# Patient Record
Sex: Female | Born: 1980 | Hispanic: No | Marital: Married | Smoking: Never smoker
Health system: Southern US, Community
[De-identification: ages and names within clinical notes are randomized; demographics above are authoritative.]

## PROBLEM LIST (undated history)

## (undated) ENCOUNTER — Inpatient Hospital Stay (HOSPITAL_COMMUNITY): Payer: Self-pay

## (undated) DIAGNOSIS — Z789 Other specified health status: Secondary | ICD-10-CM

## (undated) HISTORY — PX: TOOTH EXTRACTION: SUR596

---

## 2006-01-14 ENCOUNTER — Inpatient Hospital Stay (HOSPITAL_COMMUNITY): Admission: AD | Admit: 2006-01-14 | Discharge: 2006-01-16 | Payer: Self-pay | Admitting: Obstetrics

## 2006-06-07 ENCOUNTER — Ambulatory Visit: Payer: Self-pay | Admitting: Nurse Practitioner

## 2006-06-27 ENCOUNTER — Emergency Department (HOSPITAL_COMMUNITY): Admission: EM | Admit: 2006-06-27 | Discharge: 2006-06-27 | Payer: Self-pay | Admitting: Emergency Medicine

## 2006-10-10 ENCOUNTER — Ambulatory Visit: Payer: Self-pay | Admitting: Nurse Practitioner

## 2006-10-14 ENCOUNTER — Ambulatory Visit (HOSPITAL_COMMUNITY): Admission: RE | Admit: 2006-10-14 | Discharge: 2006-10-14 | Payer: Self-pay | Admitting: Family Medicine

## 2006-10-15 ENCOUNTER — Ambulatory Visit (HOSPITAL_COMMUNITY): Admission: RE | Admit: 2006-10-15 | Discharge: 2006-10-15 | Payer: Self-pay | Admitting: Family Medicine

## 2007-01-21 ENCOUNTER — Ambulatory Visit: Payer: Self-pay | Admitting: Nurse Practitioner

## 2007-01-22 ENCOUNTER — Ambulatory Visit: Payer: Self-pay | Admitting: *Deleted

## 2007-02-22 ENCOUNTER — Emergency Department (HOSPITAL_COMMUNITY): Admission: EM | Admit: 2007-02-22 | Discharge: 2007-02-23 | Payer: Self-pay | Admitting: Emergency Medicine

## 2007-02-26 ENCOUNTER — Ambulatory Visit: Payer: Self-pay | Admitting: Nurse Practitioner

## 2007-09-10 ENCOUNTER — Encounter (INDEPENDENT_AMBULATORY_CARE_PROVIDER_SITE_OTHER): Payer: Self-pay | Admitting: *Deleted

## 2008-05-27 ENCOUNTER — Ambulatory Visit: Payer: Self-pay | Admitting: Internal Medicine

## 2008-11-10 ENCOUNTER — Encounter (INDEPENDENT_AMBULATORY_CARE_PROVIDER_SITE_OTHER): Payer: Self-pay | Admitting: Adult Health

## 2008-11-10 ENCOUNTER — Ambulatory Visit: Payer: Self-pay | Admitting: Internal Medicine

## 2008-11-10 LAB — CONVERTED CEMR LAB
Albumin: 4.8 g/dL (ref 3.5–5.2)
Alkaline Phosphatase: 40 units/L (ref 39–117)
BUN: 10 mg/dL (ref 6–23)
Eosinophils Absolute: 0 10*3/uL (ref 0.0–0.7)
Eosinophils Relative: 1 % (ref 0–5)
Glucose, Bld: 86 mg/dL (ref 70–99)
HCT: 41.5 % (ref 36.0–46.0)
Helicobacter Pylori Antibody-IgG: 2 — ABNORMAL HIGH
Lipase: 28 units/L (ref 0–75)
Lymphs Abs: 2.3 10*3/uL (ref 0.7–4.0)
MCV: 88.3 fL (ref 78.0–100.0)
Monocytes Relative: 5 % (ref 3–12)
RBC: 4.7 M/uL (ref 3.87–5.11)
Total Bilirubin: 0.5 mg/dL (ref 0.3–1.2)
WBC: 8.1 10*3/uL (ref 4.0–10.5)

## 2008-12-08 ENCOUNTER — Encounter: Payer: Self-pay | Admitting: Family Medicine

## 2008-12-08 ENCOUNTER — Ambulatory Visit: Payer: Self-pay | Admitting: Family Medicine

## 2008-12-08 LAB — CONVERTED CEMR LAB
Basophils Absolute: 0 10*3/uL (ref 0.0–0.1)
Hemoglobin: 13.4 g/dL (ref 12.0–15.0)
Lymphocytes Relative: 24 % (ref 12–46)
Monocytes Absolute: 0.5 10*3/uL (ref 0.1–1.0)
Neutro Abs: 6.9 10*3/uL (ref 1.7–7.7)
RBC: 4.72 M/uL (ref 3.87–5.11)
RDW: 13.1 % (ref 11.5–15.5)
Rubella: 184.6 intl units/mL — ABNORMAL HIGH
Sickle Cell Screen: NEGATIVE

## 2008-12-09 ENCOUNTER — Encounter: Payer: Self-pay | Admitting: Family Medicine

## 2008-12-15 ENCOUNTER — Ambulatory Visit: Payer: Self-pay | Admitting: Family Medicine

## 2008-12-15 ENCOUNTER — Encounter: Payer: Self-pay | Admitting: Family Medicine

## 2008-12-15 LAB — CONVERTED CEMR LAB
Blood in Urine, dipstick: NEGATIVE
Ketones, urine, test strip: NEGATIVE
Nitrite: NEGATIVE
Specific Gravity, Urine: 1.02

## 2008-12-20 ENCOUNTER — Ambulatory Visit: Payer: Self-pay | Admitting: Family Medicine

## 2008-12-23 ENCOUNTER — Ambulatory Visit: Payer: Self-pay | Admitting: Family Medicine

## 2008-12-23 ENCOUNTER — Encounter: Payer: Self-pay | Admitting: Family Medicine

## 2009-01-13 ENCOUNTER — Ambulatory Visit: Payer: Self-pay | Admitting: Family Medicine

## 2009-01-13 LAB — CONVERTED CEMR LAB

## 2009-02-16 ENCOUNTER — Ambulatory Visit: Payer: Self-pay | Admitting: Family Medicine

## 2009-02-16 ENCOUNTER — Encounter: Payer: Self-pay | Admitting: Family Medicine

## 2009-02-18 ENCOUNTER — Encounter: Payer: Self-pay | Admitting: Family Medicine

## 2009-03-16 ENCOUNTER — Encounter: Payer: Self-pay | Admitting: Family Medicine

## 2009-03-17 ENCOUNTER — Ambulatory Visit: Payer: Self-pay | Admitting: Family Medicine

## 2009-03-24 ENCOUNTER — Encounter: Payer: Self-pay | Admitting: Family Medicine

## 2009-03-24 DIAGNOSIS — B019 Varicella without complication: Secondary | ICD-10-CM | POA: Insufficient documentation

## 2009-04-14 ENCOUNTER — Ambulatory Visit: Payer: Self-pay | Admitting: Family Medicine

## 2009-05-03 ENCOUNTER — Ambulatory Visit: Payer: Self-pay | Admitting: Family Medicine

## 2009-05-03 ENCOUNTER — Encounter: Payer: Self-pay | Admitting: Family Medicine

## 2009-05-03 LAB — CONVERTED CEMR LAB
HCT: 33.4 % — ABNORMAL LOW (ref 36.0–46.0)
Hemoglobin: 11.2 g/dL — ABNORMAL LOW (ref 12.0–15.0)
MCV: 81.7 fL (ref 78.0–100.0)
RBC: 4.09 M/uL (ref 3.87–5.11)
WBC: 10.5 10*3/uL (ref 4.0–10.5)

## 2009-05-19 ENCOUNTER — Ambulatory Visit: Payer: Self-pay | Admitting: Family Medicine

## 2009-05-19 LAB — CONVERTED CEMR LAB
Glucose, Urine, Semiquant: NEGATIVE
Protein, U semiquant: NEGATIVE

## 2009-06-02 ENCOUNTER — Ambulatory Visit: Payer: Self-pay | Admitting: Family Medicine

## 2009-06-02 ENCOUNTER — Encounter (INDEPENDENT_AMBULATORY_CARE_PROVIDER_SITE_OTHER): Payer: Self-pay | Admitting: *Deleted

## 2009-06-14 ENCOUNTER — Encounter: Payer: Self-pay | Admitting: Family Medicine

## 2009-06-14 ENCOUNTER — Ambulatory Visit: Payer: Self-pay | Admitting: Family Medicine

## 2009-06-15 ENCOUNTER — Encounter: Payer: Self-pay | Admitting: Family Medicine

## 2009-06-23 ENCOUNTER — Ambulatory Visit: Payer: Self-pay | Admitting: Family Medicine

## 2009-06-24 ENCOUNTER — Ambulatory Visit: Payer: Self-pay | Admitting: Family Medicine

## 2009-06-24 ENCOUNTER — Encounter: Payer: Self-pay | Admitting: Family Medicine

## 2009-06-29 ENCOUNTER — Ambulatory Visit: Payer: Self-pay | Admitting: Family Medicine

## 2009-07-06 ENCOUNTER — Ambulatory Visit: Payer: Self-pay | Admitting: Family Medicine

## 2009-07-12 ENCOUNTER — Ambulatory Visit: Payer: Self-pay | Admitting: Advanced Practice Midwife

## 2009-07-12 ENCOUNTER — Encounter: Payer: Self-pay | Admitting: Family Medicine

## 2009-07-12 ENCOUNTER — Inpatient Hospital Stay (HOSPITAL_COMMUNITY): Admission: AD | Admit: 2009-07-12 | Discharge: 2009-07-12 | Payer: Self-pay | Admitting: Obstetrics & Gynecology

## 2009-07-15 ENCOUNTER — Ambulatory Visit: Payer: Self-pay | Admitting: Family Medicine

## 2009-07-16 ENCOUNTER — Inpatient Hospital Stay (HOSPITAL_COMMUNITY): Admission: AD | Admit: 2009-07-16 | Discharge: 2009-07-16 | Payer: Self-pay | Admitting: *Deleted

## 2009-07-18 ENCOUNTER — Encounter: Payer: Self-pay | Admitting: Family Medicine

## 2009-07-19 ENCOUNTER — Encounter: Payer: Self-pay | Admitting: Family Medicine

## 2009-07-19 ENCOUNTER — Ambulatory Visit: Payer: Self-pay | Admitting: Obstetrics & Gynecology

## 2009-07-19 ENCOUNTER — Ambulatory Visit (HOSPITAL_COMMUNITY): Admission: RE | Admit: 2009-07-19 | Discharge: 2009-07-19 | Payer: Self-pay | Admitting: Family Medicine

## 2009-07-22 ENCOUNTER — Inpatient Hospital Stay (HOSPITAL_COMMUNITY): Admission: AD | Admit: 2009-07-22 | Discharge: 2009-07-24 | Payer: Self-pay | Admitting: Obstetrics & Gynecology

## 2009-07-22 ENCOUNTER — Ambulatory Visit: Payer: Self-pay | Admitting: Obstetrics & Gynecology

## 2009-07-22 ENCOUNTER — Ambulatory Visit: Payer: Self-pay | Admitting: Family Medicine

## 2009-07-25 ENCOUNTER — Encounter (INDEPENDENT_AMBULATORY_CARE_PROVIDER_SITE_OTHER): Payer: Self-pay | Admitting: *Deleted

## 2009-07-27 ENCOUNTER — Ambulatory Visit: Admission: RE | Admit: 2009-07-27 | Discharge: 2009-07-27 | Payer: Self-pay | Admitting: Family Medicine

## 2009-09-16 ENCOUNTER — Ambulatory Visit: Payer: Self-pay | Admitting: Family Medicine

## 2009-09-16 ENCOUNTER — Encounter: Payer: Self-pay | Admitting: Family Medicine

## 2010-05-04 ENCOUNTER — Ambulatory Visit: Payer: Self-pay | Admitting: Family Medicine

## 2010-05-04 DIAGNOSIS — L293 Anogenital pruritus, unspecified: Secondary | ICD-10-CM | POA: Insufficient documentation

## 2010-05-04 LAB — CONVERTED CEMR LAB: Whiff Test: NEGATIVE

## 2011-01-15 ENCOUNTER — Encounter: Payer: Self-pay | Admitting: Family Medicine

## 2011-01-22 ENCOUNTER — Ambulatory Visit: Admit: 2011-01-22 | Payer: Self-pay

## 2011-01-23 ENCOUNTER — Ambulatory Visit: Admission: RE | Admit: 2011-01-23 | Discharge: 2011-01-23 | Payer: Self-pay | Source: Home / Self Care

## 2011-01-23 DIAGNOSIS — H9209 Otalgia, unspecified ear: Secondary | ICD-10-CM | POA: Insufficient documentation

## 2011-01-23 DIAGNOSIS — J029 Acute pharyngitis, unspecified: Secondary | ICD-10-CM | POA: Insufficient documentation

## 2011-01-23 DIAGNOSIS — H6120 Impacted cerumen, unspecified ear: Secondary | ICD-10-CM | POA: Insufficient documentation

## 2011-01-23 LAB — CONVERTED CEMR LAB: Rapid Strep: NEGATIVE

## 2011-01-23 NOTE — Assessment & Plan Note (Signed)
Summary: female problem,tcb   Vital Signs:  Patient profile:   29 year old female Weight:      154 pounds Pulse rate:   80 / minute BP sitting:   114 / 79  (left arm) Cuff size:   regular  Vitals Entered By: Arlyss Repress CMA, (May 04, 2010 3:39 PM) CC: vaginal itching x 1 week. used vagisil OTC, not helping. denies vag d/c Is Patient Diabetic? No Pain Assessment Patient in pain? no        Primary Care Provider:  CAT TA MD  CC:  vaginal itching x 1 week. used vagisil OTC and not helping. denies vag d/c.  History of Present Illness:  Vaginal itching x 1 week, noticed redness on outside of vagina, no vaginal discharge, no abd pain, no fever. Denies change in soap, toliet paper, lotions. No pain with sex. LMP approx 8 months ago, still breastfeeding  Habits & Providers  Alcohol-Tobacco-Diet     Tobacco Status: never  Current Medications (verified): 1)  Gnp Prenatal Vitamins  Tabs (Prenatal Vit-Fe Fumarate-Fa) .... Take One Tablet By Mouth Once Daily 2)  Triamcinolone Acetonide 0.1 % Oint (Triamcinolone Acetonide) .... Apply To Affected Areas Two Times A Day X 10 Days As Needed Itching  Allergies (verified): No Known Drug Allergies  Physical Exam  General:  Well-developed,well-nourished,in no acute distress; alert,appropriate and cooperative throughout examination Vital signs noted  Genitalia:  normal introitus, mucosa pink and moist, no vaginal or cervical lesions, and no friaility or hemorrhage. Clear non odorous discharge visualzied. Erythema and irriation of labia majora noted into inguinal creases. No LAD   Impression & Recommendations:  Problem # 1:  VAGINAL PRURITUS (ICD-698.1) Assessment New  No signs of infection.wet prep normal. Will treat symptoms avoid allergens, discussed lubrication during sex. Unsure of cause of current episode of irritation. Topical Steroid low dose ointment as needed. Pt also asked about a salt water rinse they use in her country,  told her it was okay as long as it was placed inside the vaginal canal.  Orders: [facility]- Est Level  3 (27253)  Complete Medication List: 1)  Gnp Prenatal Vitamins Tabs (Prenatal vit-fe fumarate-fa) .... Take one tablet by mouth once daily 2)  Triamcinolone Acetonide 0.1 % Oint (Triamcinolone acetonide) .... Apply to affected areas two times a day x 10 days as needed itching  [facility]  Patient Instructions: 1)  It is okay to try the salt water, do not place inside  2)  Use the steroid on skin only Prescriptions: TRIAMCINOLONE ACETONIDE 0.1 % OINT (TRIAMCINOLONE ACETONIDE) apply to affected areas two times a day x 10 days as needed itching  #1 x 0   Entered and Authorized by:   Milinda Antis MD   Signed by:   Milinda Antis MD on 05/04/2010   Method used:   Electronically to        [facility] [address] (retail)       [phone]       [place], [place]  [address]       Ph: [phone]       Fax: [phone]   RxID:   [id number]   Laboratory Results  Date/Time Received: May 04, 2010 4:09 PM  Date/Time Reported: May 04, 2010 4:15 PM   [employer] Source: vaginal WBC/hpf: 5-10 Bacteria/hpf: 1+  Rods Clue cells/hpf: none  Negative whiff Yeast/hpf: none Trichomonas/hpf: none Comments: ...........test performed  by...........Marland KitchenTerese Door, CMA

## 2011-01-31 NOTE — Assessment & Plan Note (Signed)
Summary: cant hear/ta pt/eo   Vital Signs:  Patient profile:   30 year old female Height:      64.5 inches Weight:      148 pounds BMI:     25.10 Temp:     98.5 degrees F oral Pulse rate:   96 / minute BP sitting:   130 / 84  (left arm) Cuff size:   regular  Vitals Entered By: Tessie Fass CMA (January 23, 2011 1:41 PM) CC: sore throat. pressure in both ears   Primary Care Provider:  CAT TA MD  CC:  sore throat. pressure in both ears.  History of Present Illness: Hearing decreased, ears are painful for over one week.  Has post nasal drainage, no cough.    Current Medications (verified): 1)  Gnp Prenatal Vitamins  Tabs (Prenatal Vit-Fe Fumarate-Fa) .... Take One Tablet By Mouth Once Daily 2)  Triamcinolone Acetonide 0.1 % Oint (Triamcinolone Acetonide) .... Apply To Affected Areas Two Times A Day X 10 Days As Needed Itching 3)  Flonase 50 Mcg/act Susp (Fluticasone Propionate) .... 2 Sprays in Each Nostril Daily  Allergies: No Known Drug Allergies  Review of Systems General:  Denies chills and fever. ENT:  Complains of decreased hearing, earache, nasal congestion, postnasal drainage, and sore throat; denies sinus pressure. Resp:  Denies cough.  Physical Exam  General:  Well-developed,well-nourished,in no acute distress; alert,appropriate and cooperative throughout examination Ears:  Thick cerumen impactions irrigated out, revealing TM that were retracted Nose:  some inflammation Mouth:  post nasal drainage Neck:  No deformities, masses, or tenderness noted. Lungs:  normal respiratory effort and normal breath sounds.   Heart:  normal rate and regular rhythm.     Impression & Recommendations:  Problem # 1:  CERUMEN IMPACTION, BILATERAL (ICD-380.4) could account for decreased hearing. Orders: Cerumen Impaction Removal-[facility] (78469) [facility]- Est Level  3 (62952)  Problem # 2:  EAR PAIN, BILATERAL (ICD-388.70) suspect inflammatory from post viral infection, add nasal  steroid Orders: Cerumen Impaction Removal-[facility] (84132) [facility]- Est Level  3 (44010)  Complete Medication List: 1)  Gnp Prenatal Vitamins Tabs (Prenatal vit-fe fumarate-fa) .... Take one tablet by mouth once daily 2)  Triamcinolone Acetonide 0.1 % Oint (Triamcinolone acetonide) .... Apply to affected areas two times a day x 10 days as needed itching 3)  Flonase 50 Mcg/act Susp (Fluticasone propionate) .... 2 sprays in each nostril daily  Other Orders: Rapid Strep-[facility] (27253)  Patient Instructions: 1)  Contracted a viral illness that inflammed your sinuses and middle ear 2)  Treat the inflammation with nasal cortisone spray, stay on it for 2 week Prescriptions: FLONASE 50 MCG/ACT SUSP (FLUTICASONE PROPIONATE) 2 sprays in each nostril daily  #1 x 0   Entered and Authorized by:   Luretha Murphy NP   Signed by:   Luretha Murphy NP on 01/23/2011   Method used:   Electronically to        [facility] [address] (retail)       [phone]       [place], [place]  [address]       Ph: [phone]       Fax: [phone]   RxID:   [id number]    Orders Added: 1)  Rapid Strep-[facility] [87430] 2)  Cerumen Impaction Removal-[facility] [55732] 3)  [facility]- Est Level  3 [20254]    Laboratory Results  Date/Time Received: January 23, 2011 1:48 PM  Date/Time Reported: January 23, 2011 2:18 PM   Other Tests  Rapid Strep: negative Comments: ...............test performed by......Marland KitchenBonnie A. [place], MLS (ASCP)cm

## 2011-04-01 LAB — GLUCOSE, CAPILLARY: Glucose-Capillary: 172 mg/dL — ABNORMAL HIGH (ref 70–99)

## 2011-04-01 LAB — CBC
Hemoglobin: 10.9 g/dL — ABNORMAL LOW (ref 12.0–15.0)
MCHC: 32.3 g/dL (ref 30.0–36.0)
MCV: 76.7 fL — ABNORMAL LOW (ref 78.0–100.0)
RBC: 4.17 MIL/uL (ref 3.87–5.11)
RDW: 16.2 % — ABNORMAL HIGH (ref 11.5–15.5)
WBC: 12.5 10*3/uL — ABNORMAL HIGH (ref 4.0–10.5)

## 2011-04-01 LAB — URINE CULTURE: Colony Count: 6000

## 2011-04-01 LAB — URINALYSIS, ROUTINE W REFLEX MICROSCOPIC
Bilirubin Urine: NEGATIVE
Ketones, ur: 15 mg/dL — AB
Nitrite: NEGATIVE
Protein, ur: NEGATIVE mg/dL

## 2011-04-01 LAB — RPR: RPR Ser Ql: NONREACTIVE

## 2011-04-01 LAB — URINE MICROSCOPIC-ADD ON

## 2011-09-10 ENCOUNTER — Other Ambulatory Visit (HOSPITAL_COMMUNITY)
Admission: RE | Admit: 2011-09-10 | Discharge: 2011-09-10 | Disposition: A | Discharge status: Home / Self Care | Payer: Self-pay | Source: Ambulatory Visit | Attending: Family Medicine | Admitting: Family Medicine

## 2011-09-10 ENCOUNTER — Ambulatory Visit (INDEPENDENT_AMBULATORY_CARE_PROVIDER_SITE_OTHER): Payer: Self-pay | Admitting: Family Medicine

## 2011-09-10 ENCOUNTER — Encounter: Payer: Self-pay | Admitting: Family Medicine

## 2011-09-10 VITALS — BP 121/79 | HR 64 | Wt 154.5 lb

## 2011-09-10 DIAGNOSIS — Z01419 Encounter for gynecological examination (general) (routine) without abnormal findings: Secondary | ICD-10-CM | POA: Insufficient documentation

## 2011-09-10 DIAGNOSIS — Z124 Encounter for screening for malignant neoplasm of cervix: Secondary | ICD-10-CM

## 2011-09-10 DIAGNOSIS — N926 Irregular menstruation, unspecified: Secondary | ICD-10-CM

## 2011-09-10 DIAGNOSIS — N912 Amenorrhea, unspecified: Secondary | ICD-10-CM | POA: Insufficient documentation

## 2011-09-10 LAB — COMPREHENSIVE METABOLIC PANEL
AST: 22 U/L (ref 0–37)
Albumin: 4.7 g/dL (ref 3.5–5.2)
Alkaline Phosphatase: 50 U/L (ref 39–117)
Glucose, Bld: 87 mg/dL (ref 70–99)
Potassium: 3.9 mEq/L (ref 3.5–5.3)
Sodium: 138 mEq/L (ref 135–145)
Total Protein: 7.8 g/dL (ref 6.0–8.3)

## 2011-09-10 LAB — CBC
MCH: 27.7 pg (ref 26.0–34.0)
MCHC: 32.3 g/dL (ref 30.0–36.0)
Platelets: 246 10*3/uL (ref 150–400)
RBC: 4.91 MIL/uL (ref 3.87–5.11)
RDW: 13.1 % (ref 11.5–15.5)

## 2011-09-10 LAB — POCT WET PREP (WET MOUNT)
Clue Cells Wet Prep HPF POC: NEGATIVE
Trichomonas Wet Prep HPF POC: NEGATIVE
Yeast Wet Prep HPF POC: NEGATIVE

## 2011-09-10 LAB — TSH: TSH: 1.353 u[IU]/mL (ref 0.350–4.500)

## 2011-09-10 NOTE — Patient Instructions (Signed)
Sometimes stress on your body can make you miss a period Will check labwork today Follow-up with your PCP Dr. Fara Boros in 1-2 months. Use a supplement with 400-800 mg of folic acid

## 2011-09-10 NOTE — Progress Notes (Signed)
  Subjective:    Patient ID: Sheila Gonzales, female    DOB: [birth year], 30 y.o.   MRN: [id number]  HPI  Here for amenorrhea for 2 months.  Has some light spotting 2 weeks ago.  Negative home pregnancy tests.  Had normal periods for past 9 years.  No contraception, some condom use.  + constipation, losing hair.  Notes increasingly stressed- worried about kids.  Also notes worsening stress since moving to [place] in 2006.  No recent change sin health or medications. Review of Systems See hpi     Objective:   Physical Exam GEN: Alert & Oriented, No acute distress CV:  Regular Rate & Rhythm, no murmur Respiratory:  Normal work of breathing, CTAB Abd:  + BS, soft, no tenderness to palpation Ext: no pre-tibial edema Pelvic Exam:        External: normal female genitalia without lesions or masses        Vagina: normal without lesions or masses        Cervix: normal without lesions or masses        Adnexa: normal bimanual exam without masses or fullness        Uterus: normal by palpation        Pap smear: performed        Samples for Wet prep, GC/Chlamydia obtained         Assessment & Plan:

## 2011-09-11 ENCOUNTER — Encounter: Payer: Self-pay | Admitting: Family Medicine

## 2011-09-11 NOTE — Assessment & Plan Note (Signed)
Within range of normal to miss period for one month occasionally- discussed this with patient.  Given other symptoms and patient concern, will check TSH, CBC, CMET, TSH, FSH.  upreg neg today  Advised to continue to watch.  Return if no further period in 1-2 months.  Advised folic acid supplementation as she does not use contraception.

## 2011-09-26 ENCOUNTER — Ambulatory Visit: Payer: Self-pay | Admitting: Family Medicine

## 2011-09-28 LAB — GLUCOSE, CAPILLARY: Glucose-Capillary: 189 mg/dL — ABNORMAL HIGH (ref 70–99)

## 2013-08-17 ENCOUNTER — Ambulatory Visit (INDEPENDENT_AMBULATORY_CARE_PROVIDER_SITE_OTHER): Payer: Self-pay | Admitting: Family Medicine

## 2013-08-17 ENCOUNTER — Encounter: Payer: Self-pay | Admitting: Family Medicine

## 2013-08-17 VITALS — BP 109/73 | HR 92 | Ht 64.5 in | Wt 150.0 lb

## 2013-08-17 DIAGNOSIS — R519 Headache, unspecified: Secondary | ICD-10-CM | POA: Insufficient documentation

## 2013-08-17 DIAGNOSIS — R51 Headache: Secondary | ICD-10-CM | POA: Insufficient documentation

## 2013-08-17 DIAGNOSIS — Z3201 Encounter for pregnancy test, result positive: Secondary | ICD-10-CM

## 2013-08-17 DIAGNOSIS — Z3481 Encounter for supervision of other normal pregnancy, first trimester: Secondary | ICD-10-CM

## 2013-08-17 DIAGNOSIS — N912 Amenorrhea, unspecified: Secondary | ICD-10-CM

## 2013-08-17 DIAGNOSIS — Z3480 Encounter for supervision of other normal pregnancy, unspecified trimester: Secondary | ICD-10-CM | POA: Insufficient documentation

## 2013-08-17 NOTE — Patient Instructions (Addendum)
Thank you for coming in today. Our test confirmed that you are pregnant, congratulations! From my calculations you are 14 weeks and 4 days pregnant. We gave you a letter that you will need to apply for medicaid. When you have done that and it is pending you can call us to schedule your first ob appointment with all the necessary labs.  For your headaches I recommend drinking more water. If that is not enough you can take tylenol as needed for pain.  I look forward to seeing you throughout your pregnancy. Please don't hesitate to let me know if there is anything I can do to help.  Dr. Richarda Blade  Pregnancy, First Trimester The first trimester is the first 3 months your baby is growing inside you. It is important to follow your doctor's instructions. HOME CARE   Do not smoke.  Do not drink alcohol.  Only take medicine as told by your doctor.  Exercise.  Eat healthy foods. Eat regular, well-balanced meals.  You can have sex (intercourse) if there are no other problems with the pregnancy.  Things that help with morning sickness:  Eat soda crackers before getting up in the morning.  Eat 4 to 5 small meals rather than 3 large meals.  Drink liquids between meals, not during meals.  Go to all appointments as told.  Take all vitamins or supplements as told by your doctor. GET HELP RIGHT AWAY IF:   You develop a fever.  You have a bad smelling fluid that is leaking from your vagina.  There is bleeding from the vagina.  You develop severe belly (abdominal) or back pain.  You throw up (vomit) blood. It may look like coffee grounds.  You lose more than 2 pounds in a week.  You gain 5 pounds or more in a week.  You gain more than 2 pounds in a week and you see puffiness (swelling) in your feet, ankles, or legs.  You have severe dizziness or pass out (faint).  You are around people who have [place] measles, chickenpox, or fifth disease.  You have a headache, watery poop  (diarrhea), pain with peeing (urinating), or cannot breath right. Document Released: 05/28/2008 Document Revised: 03/03/2012 Document Reviewed: 05/28/2008 [facility] Patient Information 2014 [place], [place].

## 2013-08-17 NOTE — Assessment & Plan Note (Signed)
Occur after being outdoors, more since pregnancy - rec increased water intake and tylenol prn

## 2013-08-17 NOTE — Assessment & Plan Note (Addendum)
[id number] - Uncomplicated. Children healthy ages 31, 25 and 4 Patient has positive pregnancy test in office today. Needs letter from [place] for medicaid. Will schedule first OB visit when this is pending. - advised not to smoke or drink alcohol, drink lots of water, eat healthy foods in small frequent meals.

## 2013-08-17 NOTE — Progress Notes (Signed)
  Subjective:    Patient ID: Sheila Gonzales, female    DOB: 12-02-1981, 32 y.o.   MRN: [id number]  Possible Pregnancy   Patient presents with 3 months of amenorrhea for confirmatory pregnancy test.  LMP 05/07/13 [place] [redacted] weeks 4 days [facility] 02/11/14  Patient reports occasional headaches, usually after being outside. Also endorses feeling tired since becoming pregnant but says this is getting better lately. She denies nausea, vomitting but endorses some increased gas and bloating. No constipation.   Review of Systems See HPI    Objective:   Physical Exam  Constitutional: She is oriented to person, place, and time. She appears well-developed and well-nourished. No distress.  HENT:  Head: Normocephalic and atraumatic.  Right Ear: External ear normal.  Left Ear: External ear normal.  Nose: Nose normal.  Mouth/Throat: No oropharyngeal exudate.  Eyes: Conjunctivae are normal. Right eye exhibits no discharge. Left eye exhibits no discharge. No scleral icterus.  Cardiovascular: Normal rate, regular rhythm and normal heart sounds.   No murmur heard. Pulmonary/Chest: Effort normal and breath sounds normal. No respiratory distress. She has no wheezes. She has no rales. She exhibits no tenderness.  Abdominal: Soft. She exhibits no distension and no mass. There is no tenderness.  Fundus palpable ~3cm below umbilicus  Musculoskeletal: Normal range of motion. She exhibits no edema and no tenderness.  Neurological: She is alert and oriented to person, place, and time.  Skin: Skin is warm and dry. She is not diaphoretic. No erythema.  Psychiatric: She has a normal mood and affect.          Assessment & Plan:

## 2013-09-04 ENCOUNTER — Encounter (HOSPITAL_COMMUNITY): Payer: Self-pay | Admitting: *Deleted

## 2013-09-04 ENCOUNTER — Inpatient Hospital Stay (HOSPITAL_COMMUNITY)
Admission: AD | Admit: 2013-09-04 | Discharge: 2013-09-04 | Disposition: A | Discharge status: Home / Self Care | Payer: Medicaid Other | Source: Ambulatory Visit | Attending: Obstetrics & Gynecology | Admitting: Obstetrics & Gynecology

## 2013-09-04 DIAGNOSIS — O0930 Supervision of pregnancy with insufficient antenatal care, unspecified trimester: Secondary | ICD-10-CM | POA: Insufficient documentation

## 2013-09-04 DIAGNOSIS — M545 Low back pain, unspecified: Secondary | ICD-10-CM | POA: Insufficient documentation

## 2013-09-04 DIAGNOSIS — R1031 Right lower quadrant pain: Secondary | ICD-10-CM | POA: Insufficient documentation

## 2013-09-04 DIAGNOSIS — O99891 Other specified diseases and conditions complicating pregnancy: Secondary | ICD-10-CM | POA: Insufficient documentation

## 2013-09-04 DIAGNOSIS — N949 Unspecified condition associated with female genital organs and menstrual cycle: Secondary | ICD-10-CM | POA: Insufficient documentation

## 2013-09-04 HISTORY — DX: Other specified health status: Z78.9

## 2013-09-04 LAB — WET PREP, GENITAL
Clue Cells Wet Prep HPF POC: NONE SEEN
Trich, Wet Prep: NONE SEEN
Yeast Wet Prep HPF POC: NONE SEEN

## 2013-09-04 LAB — URINALYSIS, ROUTINE W REFLEX MICROSCOPIC
Glucose, UA: NEGATIVE mg/dL
Hgb urine dipstick: NEGATIVE
Ketones, ur: NEGATIVE mg/dL
Protein, ur: NEGATIVE mg/dL

## 2013-09-04 LAB — OB RESULTS CONSOLE GC/CHLAMYDIA
Chlamydia: NEGATIVE
Gonorrhea: NEGATIVE

## 2013-09-04 NOTE — MAU Note (Signed)
RLQ & R lower back pain x 4 days, sharp vaginal pain @ times.  Denies bleeding or LOF.  EDC 2/19.  No prenatal care, waiting for Medicaid.

## 2013-09-04 NOTE — MAU Provider Note (Signed)
  History     CSN: [id number]  Arrival date and time: 09/04/13 1610   First Provider Initiated Contact with Patient 09/04/13 1704      Chief Complaint  Patient presents with  . Abdominal Pain  . Back Pain   HPI This is a 32 y.o. female at [redacted]w[redacted]d who presents with c/o low back pain and some RLQ pain for several days. Has occasional sharp vaginal pains.  No leaking or bleeding. No fever, N/V/D/C.   RN Note: RLQ & R lower back pain x 4 days, sharp vaginal pain @ times. Denies bleeding or LOF. EDC 2/19. No prenatal care, waiting for Medicaid.      OB History   Grav Para Term Preterm Abortions TAB SAB Ect Mult Living   4 3 3       3       Past Medical History  Diagnosis Date  . Medical history non-contributory     Past Surgical History  Procedure Laterality Date  . Tooth extraction      Family History  Problem Relation Age of Onset  . Heart disease Father 61    heart failure  . Cancer Maternal Grandmother     eye cancer  . Diabetes Neg Hx   . Stroke Neg Hx     History  Substance Use Topics  . Smoking status: Never Smoker   . Smokeless tobacco: Never Used  . Alcohol Use: No    Allergies: No Known Allergies  No prescriptions prior to admission    Review of Systems  Constitutional: Negative for fever, chills and malaise/fatigue.  Gastrointestinal: Positive for abdominal pain. Negative for nausea, vomiting, diarrhea and constipation.  Genitourinary: Negative for dysuria.  Musculoskeletal: Positive for back pain.  Neurological: Positive for dizziness. Negative for weakness and headaches.   Physical Exam   Blood pressure 111/71, pulse 79, temperature 98 F (36.7 C), temperature source Oral, resp. rate 18, height 5' 4.5" (1.638 m), weight 69.854 kg (154 lb), last menstrual period 05/07/2013.  Physical Exam  Constitutional: She is oriented to person, place, and time. She appears well-developed and well-nourished. No distress.  Cardiovascular: Normal rate.    Respiratory: Effort normal.  GI: Soft. She exhibits no distension and no mass. There is tenderness (Over right round ligament). There is no rebound and no guarding.  Genitourinary:  Cervix long and closed No cervical motion tenderness  Musculoskeletal: Normal range of motion.  Neurological: She is alert and oriented to person, place, and time.  Skin: Skin is warm and dry.  Psychiatric: She has a normal mood and affect.   UA and wet prep both negative   MAU Course  Procedures  MDM Will schedule [place] for 1-2 weeks  Assessment and Plan  A:  SIUP at [redacted]w[redacted]d       Round ligament pain      No prenatal care  P:  Discharge home       Comfort measures       Will refer to clinic  [facility] 09/04/2013, 5:37 PM

## 2013-09-22 ENCOUNTER — Ambulatory Visit (HOSPITAL_COMMUNITY): Admission: RE | Admit: 2013-09-22 | Payer: Self-pay | Source: Ambulatory Visit

## 2013-10-23 ENCOUNTER — Other Ambulatory Visit: Payer: Medicaid Other

## 2013-10-23 DIAGNOSIS — Z331 Pregnant state, incidental: Secondary | ICD-10-CM

## 2013-10-23 LAB — HIV ANTIBODY (ROUTINE TESTING W REFLEX): HIV: NONREACTIVE

## 2013-10-23 NOTE — Progress Notes (Signed)
PRENATAL LABS DONE TODAY [name] 

## 2013-10-24 LAB — OBSTETRIC PANEL
Basophils Absolute: 0 10*3/uL (ref 0.0–0.1)
Eosinophils Absolute: 0 10*3/uL (ref 0.0–0.7)
Hepatitis B Surface Ag: NEGATIVE
Lymphocytes Relative: 16 % (ref 12–46)
Lymphs Abs: 1.6 10*3/uL (ref 0.7–4.0)
MCH: 30.4 pg (ref 26.0–34.0)
Neutrophils Relative %: 79 % — ABNORMAL HIGH (ref 43–77)
Platelets: 235 10*3/uL (ref 150–400)
RBC: 4.05 MIL/uL (ref 3.87–5.11)
RDW: 12.9 % (ref 11.5–15.5)
WBC: 9.5 10*3/uL (ref 4.0–10.5)

## 2013-10-24 LAB — SICKLE CELL SCREEN: Sickle Cell Screen: NEGATIVE

## 2013-10-24 LAB — CULTURE, OB URINE
Colony Count: NO GROWTH
Organism ID, Bacteria: NO GROWTH

## 2013-10-30 ENCOUNTER — Encounter: Payer: Self-pay | Admitting: Family Medicine

## 2013-10-31 ENCOUNTER — Encounter (HOSPITAL_COMMUNITY): Payer: Self-pay | Admitting: Emergency Medicine

## 2013-10-31 ENCOUNTER — Emergency Department (HOSPITAL_COMMUNITY)
Admission: EM | Admit: 2013-10-31 | Discharge: 2013-10-31 | Disposition: A | Discharge status: Home / Self Care | Payer: Medicaid Other | Attending: Emergency Medicine | Admitting: Emergency Medicine

## 2013-10-31 DIAGNOSIS — R42 Dizziness and giddiness: Secondary | ICD-10-CM | POA: Insufficient documentation

## 2013-10-31 DIAGNOSIS — R000 Tachycardia, unspecified: Secondary | ICD-10-CM | POA: Insufficient documentation

## 2013-10-31 DIAGNOSIS — R51 Headache: Secondary | ICD-10-CM | POA: Insufficient documentation

## 2013-10-31 DIAGNOSIS — R002 Palpitations: Secondary | ICD-10-CM | POA: Insufficient documentation

## 2013-10-31 DIAGNOSIS — N390 Urinary tract infection, site not specified: Secondary | ICD-10-CM | POA: Insufficient documentation

## 2013-10-31 DIAGNOSIS — M542 Cervicalgia: Secondary | ICD-10-CM | POA: Insufficient documentation

## 2013-10-31 LAB — URINALYSIS, ROUTINE W REFLEX MICROSCOPIC
Nitrite: NEGATIVE
Protein, ur: NEGATIVE mg/dL
Specific Gravity, Urine: 1.011 (ref 1.005–1.030)
Urobilinogen, UA: 0.2 mg/dL (ref 0.0–1.0)

## 2013-10-31 LAB — BASIC METABOLIC PANEL
CO2: 24 mEq/L (ref 19–32)
Chloride: 99 mEq/L (ref 96–112)
GFR calc Af Amer: >90 mL/min (ref >90)
Potassium: 3.4 mEq/L — ABNORMAL LOW (ref 3.5–5.1)
Sodium: 134 mEq/L — ABNORMAL LOW (ref 135–145)

## 2013-10-31 LAB — URINE MICROSCOPIC-ADD ON

## 2013-10-31 LAB — CBC
MCV: 85.7 fL (ref 78.0–100.0)
Platelets: 221 10*3/uL (ref 150–400)
RBC: 4.06 MIL/uL (ref 3.87–5.11)
RDW: 12.2 % (ref 11.5–15.5)
WBC: 11.9 10*3/uL — ABNORMAL HIGH (ref 4.0–10.5)

## 2013-10-31 LAB — T4, FREE: Free T4: 0.95 ng/dL (ref 0.80–1.80)

## 2013-10-31 LAB — TSH: TSH: 1.628 u[IU]/mL (ref 0.350–4.500)

## 2013-10-31 MED ORDER — CEPHALEXIN 500 MG PO CAPS: 500.0000 mg | ORAL_CAPSULE | Freq: Two times a day (BID) | ORAL | Status: DC

## 2013-10-31 MED ORDER — CEPHALEXIN 500 MG PO CAPS
500.0000 mg | ORAL_CAPSULE | Freq: Once | ORAL | Status: AC
  Administered 2013-10-31: 500 mg via ORAL
  Filled 2013-10-31: qty 1

## 2013-10-31 MED ORDER — SODIUM CHLORIDE 0.9 % IV BOLUS (SEPSIS)
1000.0000 mL | Freq: Once | INTRAVENOUS | Status: AC
  Administered 2013-10-31: 1000 mL via INTRAVENOUS

## 2013-10-31 NOTE — ED Notes (Signed)
Pt on TOCO, OB rapid repsonse RN at bedside0

## 2013-10-31 NOTE — Progress Notes (Signed)
Pt presents to [facility] with c/o her head shaking. States she is unable to controll it. Also c/o heart palpitations.  Pt has 3 other children, ages 109, 61, and 4. All vaginal deliveries. Denies any vaginal bleeding or leaking of fluid. States baby has been moving as usual. Denies ever having had any seizures, migraine headaches or any significant medical hx. Pt's EKG monitor shows sinus tachycardia with a rate of 117. No ectopy.

## 2013-10-31 NOTE — Progress Notes (Signed)
Dr. Debroah Loop in surgery. Will call him back to sprak with him about this pt.

## 2013-10-31 NOTE — ED Notes (Signed)
Pt ambulated w/o difficulty

## 2013-10-31 NOTE — Progress Notes (Signed)
Talked with Dr. Debroah Loop. I told him that the pt is not contracting,no vaginal bleeding or leaking of fluid. Baseline fhr is 150, min- mod variability with 10x10 accels. Pt is okay to be cleared obstetrically.

## 2013-10-31 NOTE — ED Notes (Signed)
Called OB rapid response to assess pt

## 2013-10-31 NOTE — ED Notes (Addendum)
Pt reports being 6 months pregnant, reports heart palpitations since yesterday, left sided neck pain and head "shaking".

## 2013-10-31 NOTE — ED Provider Notes (Signed)
CSN: [id number]     Arrival date & time 10/31/13  0908 History   First MD Initiated Contact with Patient 10/31/13 [phone]     Chief Complaint  Patient presents with  . Palpitations  . Neck Pain    HPI  Sheila Gonzales is a 32 y.o. female with no PMH who presents to the ED for evaluation of palpitations.  History was provided by the patient.  Patient states that last night she developed heart palpitations. She states that they are constant with intermittent fluctuations in severity.  She states she feels a pounding in her chest, neck and head with each beat.  She also has had left sided neck pain, which was present before the onset of her palpitations.  No trauma/injuries. She denies any chest pain or SOB.  She denies any hx of cardiac disease.  She has a mild headache and lightheadedness.  She states she has been eating and drinking adequately.  No emesis or diarrhea.  Patient is 6 month pregnant on 11/11/13.  G4.  She has been obtaining her OB/GYN care from her family practice clinic with no established OB/GYN.  She denies any PMH or hx of previous OB/GYN problems in the past with her previous deliveries.  She has otherwise been well with no fevers, cough, abdominal pain, vaginal bleeding/discharge/leakage, dysuria, or weakness.      Past Medical History  Diagnosis Date  . Medical history non-contributory    Past Surgical History  Procedure Laterality Date  . Tooth extraction     Family History  Problem Relation Age of Onset  . Heart disease Father 43    heart failure  . Cancer Maternal Grandmother     eye cancer  . Diabetes Neg Hx   . Stroke Neg Hx    History  Substance Use Topics  . Smoking status: Never Smoker   . Smokeless tobacco: Never Used  . Alcohol Use: No   OB History   Grav Para Term Preterm Abortions TAB SAB Ect Mult Living   4 3 3       3      Review of Systems  Constitutional: Negative for fever, chills, activity change, appetite change and fatigue.  HENT:  Negative for congestion and rhinorrhea.   Eyes: Negative for visual disturbance.  Respiratory: Negative for cough and shortness of breath.   Cardiovascular: Positive for palpitations. Negative for chest pain and leg swelling.  Gastrointestinal: Negative for nausea, vomiting, abdominal pain, diarrhea and constipation.  Genitourinary: Negative for dysuria, hematuria, decreased urine volume, vaginal bleeding, vaginal discharge, vaginal pain and pelvic pain.  Musculoskeletal: Positive for neck pain. Negative for back pain and gait problem.  Skin: Negative for wound.  Neurological: Positive for light-headedness and headaches. Negative for dizziness, syncope, weakness and numbness.    Allergies  Review of patient's allergies indicates no known allergies.  Home Medications  No current outpatient prescriptions on file. BP 124/65  Pulse 132  Temp(Src) 98.7 F (37.1 C) (Oral)  Resp 22  SpO2 99%  LMP 05/07/2013  Filed Vitals:   10/31/13 1145 10/31/13 1146 10/31/13 1147 10/31/13 1403  BP: 105/62 105/58 105/57 100/64  Pulse: 109 119 103 100  Temp:      TempSrc:      Resp:    16  SpO2:    99%     Physical Exam  Nursing note and vitals reviewed. Constitutional: She is oriented to person, place, and time. She appears well-developed and well-nourished. No distress.  HENT:  Head: Normocephalic and atraumatic.  Right Ear: External ear normal.  Left Ear: External ear normal.  Nose: Nose normal.  Mouth/Throat: Oropharynx is clear and moist. No oropharyngeal exudate.  Uvula midline.  No trismus.  TM's gray and translucent bilaterally  Eyes: Conjunctivae are normal. Pupils are equal, round, and reactive to light. Right eye exhibits no discharge. Left eye exhibits no discharge.  Neck: Normal range of motion. Neck supple. No tracheal deviation present.  Focal tenderness to palpation to the anterior left superior neck with no underlying masses, erythema, edema, ecchymosis, or wounds.  No  cervical spinal tenderness.  No LAD bilaterally  Cardiovascular: Regular rhythm, normal heart sounds and intact distal pulses.  Exam reveals no gallop and no friction rub.   No murmur heard. Tachycardic.  Dorsalis pedis pulses present bilaterally    Pulmonary/Chest: Effort normal and breath sounds normal. No respiratory distress. She has no wheezes. She has no rales. She exhibits no tenderness.  Abdominal: Soft. She exhibits no distension and no mass. There is no tenderness. There is no rebound and no guarding.  Protuberant abdomen  Musculoskeletal: Normal range of motion. She exhibits no edema and no tenderness.  No calf tenderness/edema.    Lymphadenopathy:    She has no cervical adenopathy.  Neurological: She is alert and oriented to person, place, and time.  Skin: Skin is warm and dry. She is not diaphoretic.    ED Course  Procedures (including critical care time) Labs Review Labs Reviewed  CBC  BASIC METABOLIC PANEL  TSH  T4, FREE   Imaging Review No results found.  EKG Interpretation   None      Results for orders placed during the hospital encounter of 10/31/13  CBC      Result Value Range   WBC 11.9 (*) 4.0 - 10.5 K/uL   RBC 4.06  3.87 - 5.11 MIL/uL   Hemoglobin 11.8 (*) 12.0 - 15.0 g/dL   HCT 16.1 (*) 09.6 - 04.5 %   MCV 85.7  78.0 - 100.0 fL   MCH 29.1  26.0 - 34.0 pg   MCHC 33.9  30.0 - 36.0 g/dL   RDW 40.9  81.1 - 91.4 %   Platelets 221  150 - 400 K/uL  BASIC METABOLIC PANEL      Result Value Range   Sodium 134 (*) 135 - 145 mEq/L   Potassium 3.4 (*) 3.5 - 5.1 mEq/L   Chloride 99  96 - 112 mEq/L   CO2 24  19 - 32 mEq/L   Glucose, Bld 94  70 - 99 mg/dL   BUN 4 (*) 6 - 23 mg/dL   Creatinine, Ser 7.82 (*) 0.50 - 1.10 mg/dL   Calcium 9.0  8.4 - 95.6 mg/dL   GFR calc non Af Amer >90  >90 mL/min   GFR calc Af Amer >90  >90 mL/min  TSH      Result Value Range   TSH 1.628  0.350 - 4.500 uIU/mL  T4, FREE      Result Value Range   Free T4 0.95  0.80 -  1.80 ng/dL  URINALYSIS, ROUTINE W REFLEX MICROSCOPIC      Result Value Range   Color, Urine YELLOW  YELLOW   APPearance CLOUDY (*) CLEAR   Specific Gravity, Urine 1.011  1.005 - 1.030   pH 7.5  5.0 - 8.0   Glucose, UA NEGATIVE  NEGATIVE mg/dL   Hgb urine dipstick NEGATIVE  NEGATIVE   Bilirubin Urine NEGATIVE  NEGATIVE   Ketones, ur NEGATIVE  NEGATIVE mg/dL   Protein, ur NEGATIVE  NEGATIVE mg/dL   Urobilinogen, UA 0.2  0.0 - 1.0 mg/dL   Nitrite NEGATIVE  NEGATIVE   Leukocytes, UA LARGE (*) NEGATIVE  URINE MICROSCOPIC-ADD ON      Result Value Range   Squamous Epithelial / LPF MANY (*) RARE   WBC, UA 11-20  <3 WBC/hpf   Bacteria, UA RARE  RARE     Date: 10/31/2013  Rate: 125  Rhythm: sinus tachycardia  QRS Axis: normal  Intervals: normal  ST/T Wave abnormalities: normal  Conduction Disutrbances:none  Narrative Interpretation:   Old EKG Reviewed: none available   MDM   1. Palpitations   2. UTI (urinary tract infection)   3. Tachycardia     Sheila Gonzales is a 32 y.o. female with no PMH who presents to the ED for evaluation of palpitations.  TSH, T4, BMP, UA, and CBC ordered.  Orthostatic vitals ordered.    Rechecks  10:44 AM = Patient resting comfortably.  OB/GYN RN monitoring patient.  No contractions.  Will call attending to see if she can be cleared.   1:18 PM = Patient states she feels better after IV fluids.  Will ambulate and see how she does. 1:45 PM = Patient able to ambulate without difficulty/ataxia/increase in symptoms.      Etiology of palpitations possibly anxiety vs dehydration vs UTI.  Patient was cleared by OB/GYN who came to the ED to monitor the patient.  Patient's tachycardia waxed and waned in the 100's to 130's throughout her ED visit.  There appears to be some anxiety component to her fluctuant heart rate.  Low suspicion for PE at this time.  Patient has no chest pain, SOB, or leg edema/DVT. Low suspicion for cardiac causes given her low risk TIMI  score.  EKG negative for any acute ischemic changes and she is in sinus rhythm.   TSH and FT4 WNL.  Labs otherwise unremarkable.  Her HR improved with IV fluids.  She was found to have a UTI and will be treated with Keflex.  Urine sent for culture.  No vaginal bleeding/discharge/evidence of fetal demise at this time.  Patient remained stable throughout her ED visit.  She was instructed to drink plenty of fluids and follow-up with her OB/GYN.  Return precautions discussed.  Patient in agreement with discharge and plan.     Final impressions: 1. Palpitations  2. Tachycardia  3. UTI     Luiz Iron PA-C   This patient was discussed with Dr. Robyn Haber, PA-C 10/31/13 2139

## 2013-11-01 LAB — URINE CULTURE

## 2013-11-01 NOTE — ED Provider Notes (Signed)
Medical screening examination/treatment/procedure(s) were conducted as a shared visit with non-physician practitioner(s) and myself.  I personally evaluated the patient during the encounter.  EKG Interpretation     Ventricular Rate:  125 PR Interval:  139 QRS Duration: 63 QT Interval:  284 QTC Calculation: 409 R Axis:   54 Text Interpretation:  Sinus tachycardia Borderline repolarization abnormality No previous ECGs available           Well appearing. Fetus with strong HR. No leakage of fluid or bleeding. Palpitations resolved with fluids. Sinus tachycardia. No arrythmia noted. Dc home in good condition. i walked by her room on many occasions and found her HR in the low 90s.  There is an anxiety component   Lyanne Co, MD 11/01/13 [phone]

## 2013-11-05 ENCOUNTER — Other Ambulatory Visit (HOSPITAL_COMMUNITY): Payer: Self-pay | Admitting: Advanced Practice Midwife

## 2013-11-05 ENCOUNTER — Ambulatory Visit (HOSPITAL_COMMUNITY)
Admission: RE | Admit: 2013-11-05 | Discharge: 2013-11-05 | Disposition: A | Discharge status: Home / Self Care | Payer: Medicaid Other | Source: Ambulatory Visit | Attending: Advanced Practice Midwife | Admitting: Advanced Practice Midwife

## 2013-11-05 DIAGNOSIS — N949 Unspecified condition associated with female genital organs and menstrual cycle: Secondary | ICD-10-CM

## 2013-11-05 DIAGNOSIS — O0930 Supervision of pregnancy with insufficient antenatal care, unspecified trimester: Secondary | ICD-10-CM | POA: Insufficient documentation

## 2013-11-05 DIAGNOSIS — Z363 Encounter for antenatal screening for malformations: Secondary | ICD-10-CM | POA: Insufficient documentation

## 2013-11-05 DIAGNOSIS — O358XX0 Maternal care for other (suspected) fetal abnormality and damage, not applicable or unspecified: Secondary | ICD-10-CM | POA: Insufficient documentation

## 2013-11-05 DIAGNOSIS — Z1389 Encounter for screening for other disorder: Secondary | ICD-10-CM | POA: Insufficient documentation

## 2013-11-10 ENCOUNTER — Encounter: Payer: Self-pay | Admitting: Family Medicine

## 2013-11-10 ENCOUNTER — Ambulatory Visit (INDEPENDENT_AMBULATORY_CARE_PROVIDER_SITE_OTHER): Payer: Medicaid Other | Admitting: Family Medicine

## 2013-11-10 VITALS — BP 114/72 | Temp 98.4°F | Wt 168.0 lb

## 2013-11-10 DIAGNOSIS — Z3480 Encounter for supervision of other normal pregnancy, unspecified trimester: Secondary | ICD-10-CM

## 2013-11-10 DIAGNOSIS — Z3482 Encounter for supervision of other normal pregnancy, second trimester: Secondary | ICD-10-CM

## 2013-11-10 NOTE — Patient Instructions (Signed)
You are doing very well. Please start taking a chewable (non gel) vitamin Please come back to our our OB clinic in 4 weeks to meet with Dr. Lum Babe  Second Trimester of Pregnancy The second trimester is from week 13 through week 28, months 4 through 6. The second trimester is often a time when you feel your best. Your body has also adjusted to being pregnant, and you begin to feel better physically. Usually, morning sickness has lessened or quit completely, you may have more energy, and you may have an increase in appetite. The second trimester is also a time when the fetus is growing rapidly. At the end of the sixth month, the fetus is about 9 inches long and weighs about 1 pounds. You will likely begin to feel the baby move (quickening) between 18 and 20 weeks of the pregnancy. BODY CHANGES Your body goes through many changes during pregnancy. The changes vary from woman to woman.   Your weight will continue to increase. You will notice your lower abdomen bulging out.  You may begin to get stretch marks on your hips, abdomen, and breasts.  You may develop headaches that can be relieved by medicines approved by your caregiver.  You may urinate more often because the fetus is pressing on your bladder.  You may develop or continue to have heartburn as a result of your pregnancy.  You may develop constipation because certain hormones are causing the muscles that push waste through your intestines to slow down.  You may develop hemorrhoids or swollen, bulging veins (varicose veins).  You may have back pain because of the weight gain and pregnancy hormones relaxing your joints between the bones in your pelvis and as a result of a shift in weight and the muscles that support your balance.  Your breasts will continue to grow and be tender.  Your gums may bleed and may be sensitive to brushing and flossing.  Dark spots or blotches (chloasma, mask of pregnancy) may develop on your face. This  will likely fade after the baby is born.  A dark line from your belly button to the pubic area (linea nigra) may appear. This will likely fade after the baby is born. WHAT TO EXPECT AT YOUR PRENATAL VISITS During a routine prenatal visit:  You will be weighed to make sure you and the fetus are growing normally.  Your blood pressure will be taken.  Your abdomen will be measured to track your baby's growth.  The fetal heartbeat will be listened to.  Any test results from the previous visit will be discussed. Your caregiver may ask you:  How you are feeling.  If you are feeling the baby move.  If you have had any abnormal symptoms, such as leaking fluid, bleeding, severe headaches, or abdominal cramping.  If you have any questions. Other tests that may be performed during your second trimester include:  Blood tests that check for:  Low iron levels (anemia).  Gestational diabetes (between 24 and 28 weeks).  Rh antibodies.  Urine tests to check for infections, diabetes, or protein in the urine.  An ultrasound to confirm the proper growth and development of the baby.  An amniocentesis to check for possible genetic problems.  Fetal screens for spina bifida and Down syndrome. HOME CARE INSTRUCTIONS   Avoid all smoking, herbs, alcohol, and unprescribed drugs. These chemicals affect the formation and growth of the baby.  Follow your caregiver's instructions regarding medicine use. There are medicines that are either  safe or unsafe to take during pregnancy.  Exercise only as directed by your caregiver. Experiencing uterine cramps is a good sign to stop exercising.  Continue to eat regular, healthy meals.  Wear a good support bra for breast tenderness.  Do not use hot tubs, steam rooms, or saunas.  Wear your seat belt at all times when driving.  Avoid raw meat, uncooked cheese, cat litter boxes, and soil used by cats. These carry germs that can cause birth defects in the  baby.  Take your prenatal vitamins.  Try taking a stool softener (if your caregiver approves) if you develop constipation. Eat more high-fiber foods, such as fresh vegetables or fruit and whole grains. Drink plenty of fluids to keep your urine clear or pale yellow.  Take warm sitz baths to soothe any pain or discomfort caused by hemorrhoids. Use hemorrhoid cream if your caregiver approves.  If you develop varicose veins, wear support hose. Elevate your feet for 15 minutes, 3 4 times a day. Limit salt in your diet.  Avoid heavy lifting, wear low heel shoes, and practice good posture.  Rest with your legs elevated if you have leg cramps or low back pain.  Visit your dentist if you have not gone yet during your pregnancy. Use a soft toothbrush to brush your teeth and be gentle when you floss.  A sexual relationship may be continued unless your caregiver directs you otherwise.  Continue to go to all your prenatal visits as directed by your caregiver. SEEK MEDICAL CARE IF:   You have dizziness.  You have mild pelvic cramps, pelvic pressure, or nagging pain in the abdominal area.  You have persistent nausea, vomiting, or diarrhea.  You have a bad smelling vaginal discharge.  You have pain with urination. SEEK IMMEDIATE MEDICAL CARE IF:   You have a fever.  You are leaking fluid from your vagina.  You have spotting or bleeding from your vagina.  You have severe abdominal cramping or pain.  You have rapid weight gain or loss.  You have shortness of breath with chest pain.  You notice sudden or extreme swelling of your face, hands, ankles, feet, or legs.  You have not felt your baby move in over an hour.  You have severe headaches that do not go away with medicine.  You have vision changes. Document Released: 12/04/2001 Document Revised: 08/12/2013 Document Reviewed: 02/10/2013 [facility] Patient Information 2014 [place], [place].

## 2013-11-10 NOTE — Progress Notes (Signed)
Pt w/o concerns/complaints today.  Denies vaginal bleeding/discharge, RUQ pain, HA, vision change, contractions.  Regular fetal movement Not taking PNV due to upset stomach and religious concerns for possible animal (pig) contents  PE:  Gen: NAD, WNWD, Gravid HEENT: MMM, EOMI CV: RRR, no m/r/g Res: CTAB, NMl effort  A/P: 32yo F G4P3003 at 26.5 w/ single IUP presenting for initial PNC. Uncomplicated pregnancy up to this point but pt is very late to [facility] and not taking PNV (upset stomach and concern for gel vitamins containing pork product) - Pt to start non-gel chewable vitamin - Pt would prefer female provider due to religious reasons. Will accomodate this request - planning to breast feed - Return in 4 wks to [facility] clinic to meed with Dr. Lum Babe - Pt needs 1hr gtt - labor precautions reviewed

## 2013-11-11 NOTE — Progress Notes (Signed)
Reviewed

## 2013-11-18 ENCOUNTER — Encounter: Payer: Self-pay | Admitting: Family Medicine

## 2013-12-08 ENCOUNTER — Ambulatory Visit (INDEPENDENT_AMBULATORY_CARE_PROVIDER_SITE_OTHER): Payer: Medicaid Other | Admitting: Family Medicine

## 2013-12-08 VITALS — BP 115/73 | Wt 178.8 lb

## 2013-12-08 DIAGNOSIS — Z3480 Encounter for supervision of other normal pregnancy, unspecified trimester: Secondary | ICD-10-CM

## 2013-12-08 LAB — GLUCOSE, CAPILLARY
Comment 1: 1
Glucose-Capillary: 181 mg/dL — ABNORMAL HIGH (ref 70–99)

## 2013-12-08 MED ORDER — SALINE SPRAY 0.65 % NA SOLN: 1.0000 | NASAL | Status: AC | PRN

## 2013-12-08 NOTE — Patient Instructions (Signed)
For the palpitations, if you have worsening symptoms, go back to the ED.   Third Trimester of Pregnancy The third trimester is from week 29 through week 42, months 7 through 9. The third trimester is a time when the fetus is growing rapidly. At the end of the ninth month, the fetus is about 20 inches in length and weighs 6 10 pounds.  BODY CHANGES Your body goes through many changes during pregnancy. The changes vary from woman to woman.   Your weight will continue to increase. You can expect to gain 25 35 pounds (11 16 kg) by the end of the pregnancy.  You may begin to get stretch marks on your hips, abdomen, and breasts.  You may urinate more often because the fetus is moving lower into your pelvis and pressing on your bladder.  You may develop or continue to have heartburn as a result of your pregnancy.  You may develop constipation because certain hormones are causing the muscles that push waste through your intestines to slow down.  You may develop hemorrhoids or swollen, bulging veins (varicose veins).  You may have pelvic pain because of the weight gain and pregnancy hormones relaxing your joints between the bones in your pelvis. Back aches may result from over exertion of the muscles supporting your posture.  Your breasts will continue to grow and be tender. A yellow discharge may leak from your breasts called colostrum.  Your belly button may stick out.  You may feel short of breath because of your expanding uterus.  You may notice the fetus "dropping," or moving lower in your abdomen.  You may have a bloody mucus discharge. This usually occurs a few days to a week before labor begins.  Your cervix becomes thin and soft (effaced) near your due date. WHAT TO EXPECT AT YOUR PRENATAL EXAMS  You will have prenatal exams every 2 weeks until week 36. Then, you will have weekly prenatal exams. During a routine prenatal visit:  You will be weighed to make sure you and the fetus  are growing normally.  Your blood pressure is taken.  Your abdomen will be measured to track your baby's growth.  The fetal heartbeat will be listened to.  Any test results from the previous visit will be discussed.  You may have a cervical check near your due date to see if you have effaced. At around 36 weeks, your caregiver will check your cervix. At the same time, your caregiver will also perform a test on the secretions of the vaginal tissue. This test is to determine if a type of bacteria, Group B streptococcus, is present. Your caregiver will explain this further. Your caregiver may ask you:  What your birth plan is.  How you are feeling.  If you are feeling the baby move.  If you have had any abnormal symptoms, such as leaking fluid, bleeding, severe headaches, or abdominal cramping.  If you have any questions. Other tests or screenings that may be performed during your third trimester include:  Blood tests that check for low iron levels (anemia).  Fetal testing to check the health, activity level, and growth of the fetus. Testing is done if you have certain medical conditions or if there are problems during the pregnancy. FALSE LABOR You may feel small, irregular contractions that eventually go away. These are called Braxton Hicks contractions, or false labor. Contractions may last for hours, days, or even weeks before true labor sets in. If contractions come at regular  intervals, intensify, or become painful, it is best to be seen by your caregiver.  SIGNS OF LABOR   Menstrual-like cramps.  Contractions that are 5 minutes apart or less.  Contractions that start on the top of the uterus and spread down to the lower abdomen and back.  A sense of increased pelvic pressure or back pain.  A watery or bloody mucus discharge that comes from the vagina. If you have any of these signs before the 37th week of pregnancy, call your caregiver right away. You need to go to the  hospital to get checked immediately. HOME CARE INSTRUCTIONS   Avoid all smoking, herbs, alcohol, and unprescribed drugs. These chemicals affect the formation and growth of the baby.  Follow your caregiver's instructions regarding medicine use. There are medicines that are either safe or unsafe to take during pregnancy.  Exercise only as directed by your caregiver. Experiencing uterine cramps is a good sign to stop exercising.  Continue to eat regular, healthy meals.  Wear a good support bra for breast tenderness.  Do not use hot tubs, steam rooms, or saunas.  Wear your seat belt at all times when driving.  Avoid raw meat, uncooked cheese, cat litter boxes, and soil used by cats. These carry germs that can cause birth defects in the baby.  Take your prenatal vitamins.  Try taking a stool softener (if your caregiver approves) if you develop constipation. Eat more high-fiber foods, such as fresh vegetables or fruit and whole grains. Drink plenty of fluids to keep your urine clear or pale yellow.  Take warm sitz baths to soothe any pain or discomfort caused by hemorrhoids. Use hemorrhoid cream if your caregiver approves.  If you develop varicose veins, wear support hose. Elevate your feet for 15 minutes, 3 4 times a day. Limit salt in your diet.  Avoid heavy lifting, wear low heal shoes, and practice good posture.  Rest a lot with your legs elevated if you have leg cramps or low back pain.  Visit your dentist if you have not gone during your pregnancy. Use a soft toothbrush to brush your teeth and be gentle when you floss.  A sexual relationship may be continued unless your caregiver directs you otherwise.  Do not travel far distances unless it is absolutely necessary and only with the approval of your caregiver.  Take prenatal classes to understand, practice, and ask questions about the labor and delivery.  Make a trial run to the hospital.  Pack your hospital bag.  Prepare  the baby's nursery.  Continue to go to all your prenatal visits as directed by your caregiver. SEEK MEDICAL CARE IF:  You are unsure if you are in labor or if your water has broken.  You have dizziness.  You have mild pelvic cramps, pelvic pressure, or nagging pain in your abdominal area.  You have persistent nausea, vomiting, or diarrhea.  You have a bad smelling vaginal discharge.  You have pain with urination. SEEK IMMEDIATE MEDICAL CARE IF:   You have a fever.  You are leaking fluid from your vagina.  You have spotting or bleeding from your vagina.  You have severe abdominal cramping or pain.  You have rapid weight loss or gain.  You have shortness of breath with chest pain.  You notice sudden or extreme swelling of your face, hands, ankles, feet, or legs.  You have not felt your baby move in over an hour.  You have severe headaches that do not go away  with medicine.  You have vision changes. Document Released: 12/04/2001 Document Revised: 08/12/2013 Document Reviewed: 02/10/2013 [facility] Patient Information 2014 [place], [place].

## 2013-12-08 NOTE — Progress Notes (Signed)
S: reports low abdominal discomfort worst with walking. Otherwise, no contractions, vaginal bleeding, vaginal discharge. Feeling good fetal movement.  Regarding her history of tachycardia, she denies any recent episodes of heart flutter or chest discomfort. She was seen in the ED in November for palpitations and treated for a UTI. Since then, she hasn't had any reported issues.  O: see flowsheet Maternal HR: 100 A/P:  32 yo G4P3003 at 30.5wga who presents for prenatal visit - tachycardia: asymptomatic at this time, but will continue to monitor - weight gain: 10lb weight gain in 4 weeks which is more than recommended. She is measuring appropriately which is reassuring. Reviewed decreasing amounts of sweet foods: dates, raisins and reducing starchy foods like potatoes and increasing fresh vegetables.  - glucola 1hr today - follow up in 2 weeks

## 2013-12-09 ENCOUNTER — Other Ambulatory Visit (INDEPENDENT_AMBULATORY_CARE_PROVIDER_SITE_OTHER): Payer: Medicaid Other

## 2013-12-09 DIAGNOSIS — Z3480 Encounter for supervision of other normal pregnancy, unspecified trimester: Secondary | ICD-10-CM

## 2013-12-09 DIAGNOSIS — Z331 Pregnant state, incidental: Secondary | ICD-10-CM

## 2013-12-09 NOTE — Progress Notes (Signed)
3 HR GTT DONE TODAY Sheila Gonzales 

## 2013-12-10 LAB — GLUCOSE TOLERANCE, 3 HOURS
Glucose Tolerance, 1 hour: 166 mg/dL (ref 70–189)
Glucose, GTT - 3 Hour: 138 mg/dL (ref 70–144)

## 2013-12-24 NOTE — L&D Delivery Note (Signed)
Delivery Note At 3:49 PM a viable female was delivered via Vaginal, Spontaneous Delivery (Presentation: Left Occiput Anterior). Loose nuchal x1. No shoulder dystocia.   APGAR: 8, 9; weight .  Placenta status: Intact, Spontaneous.  Cord: 3 vessels with the following complications: None.    Anesthesia: None  Episiotomy: None Lacerations: none Suture Repair: none Est. Blood Loss (mL): 450  Mom to postpartum.  Baby to Couplet care / Skin to Skin.  Marena ChancyLOSQ, STEPHANIE 02/08/2014, 4:18 PM  I was present for this delivery and agree with the above resident's note.    Sharen CounterLisa Leftwich-Kirby Certified Nurse-Midwife

## 2013-12-28 ENCOUNTER — Encounter: Payer: Medicaid Other | Admitting: Family Medicine

## 2013-12-29 IMAGING — US US OB DETAIL+14 WK
2 series · 12 of 28 positions shown · non-contrast
Comparison: none

[Series 1: us ob detail +14 wk · 9 of 57 slices shown (1 of 2)]
[im 3/57]
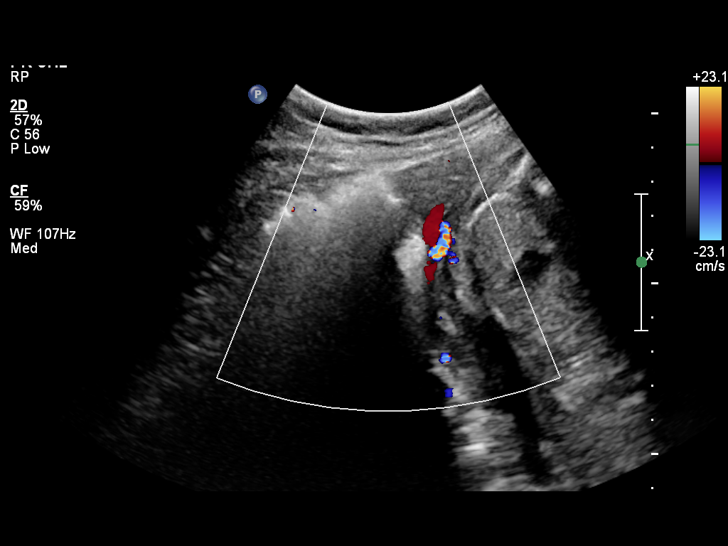
[im 9/57]
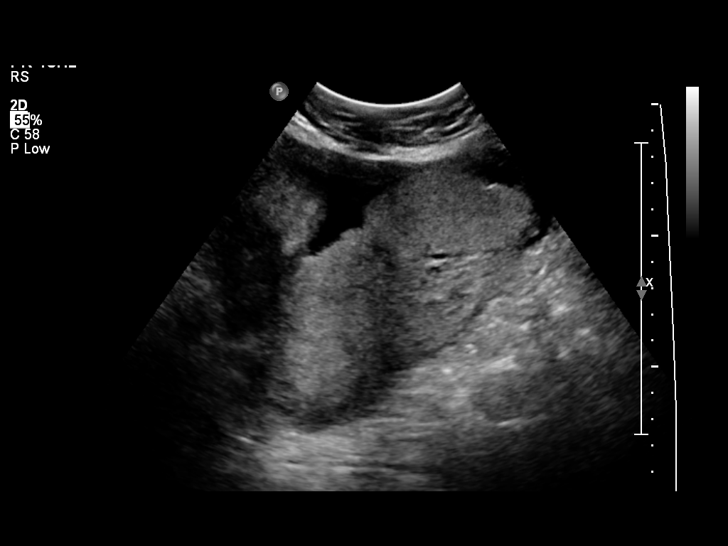
[im 14/57]
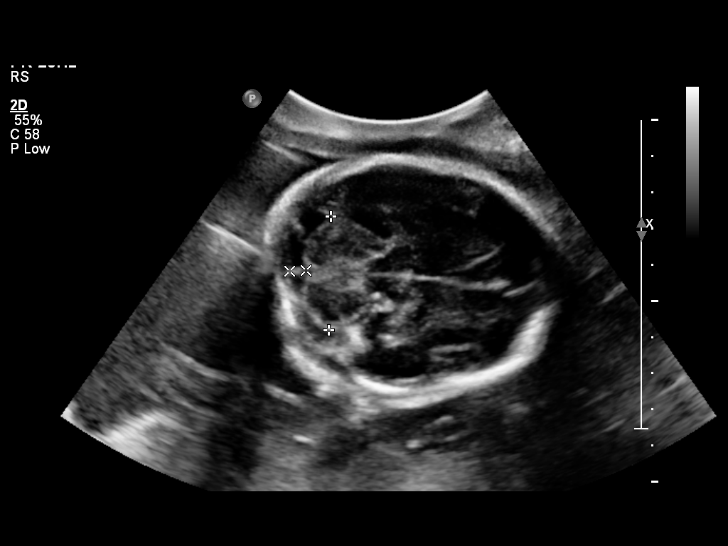
[im 22/57]
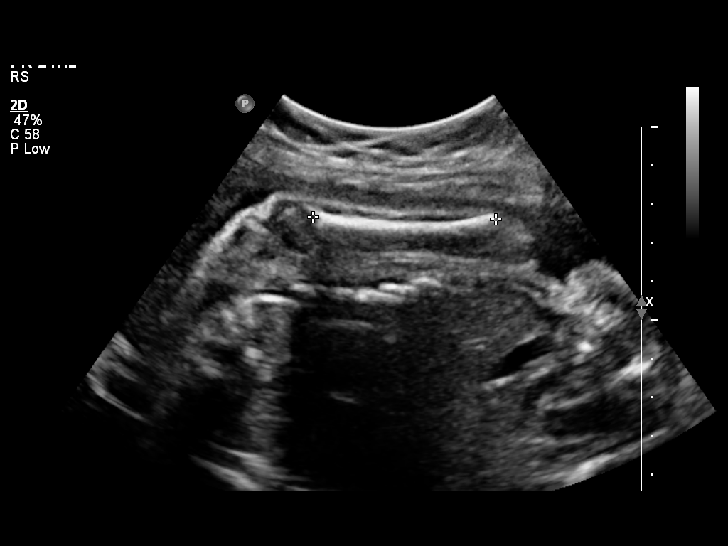
[im 27/57]
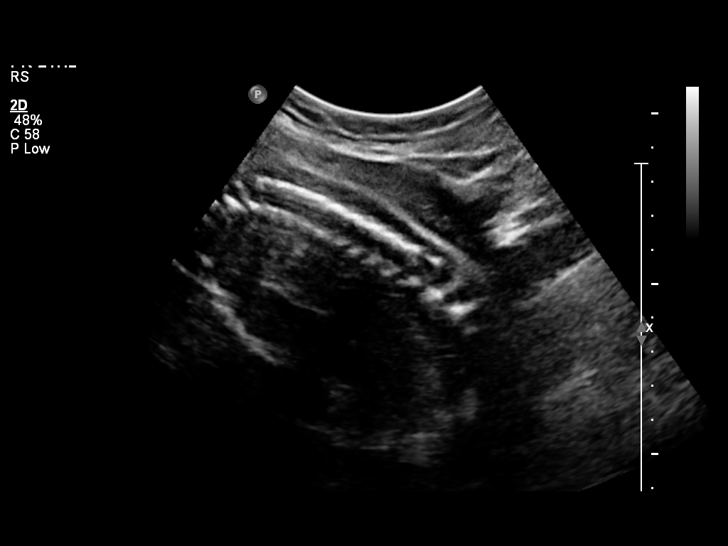
[im 33/57]
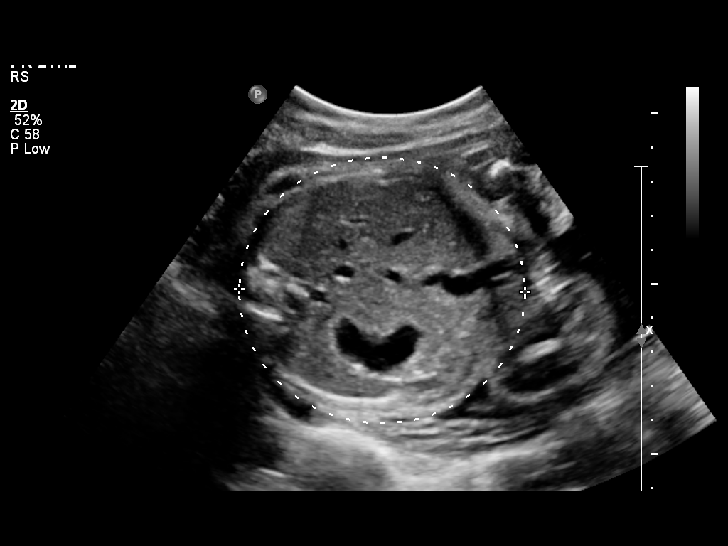
[im 41/57]
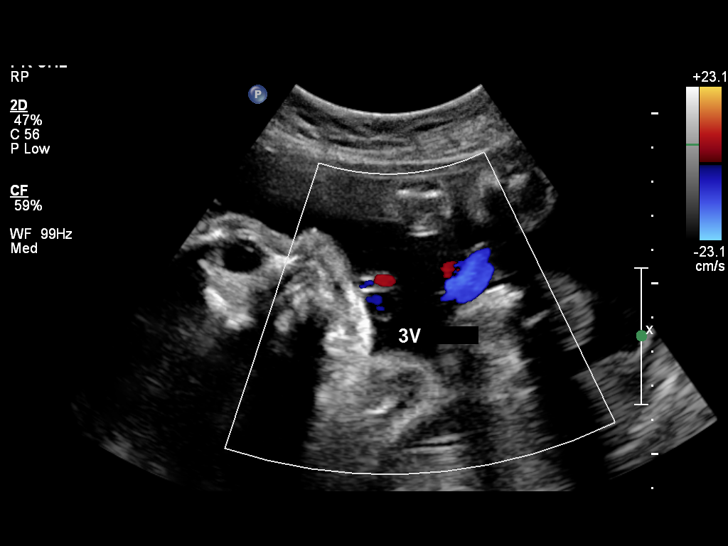
[im 46/57]
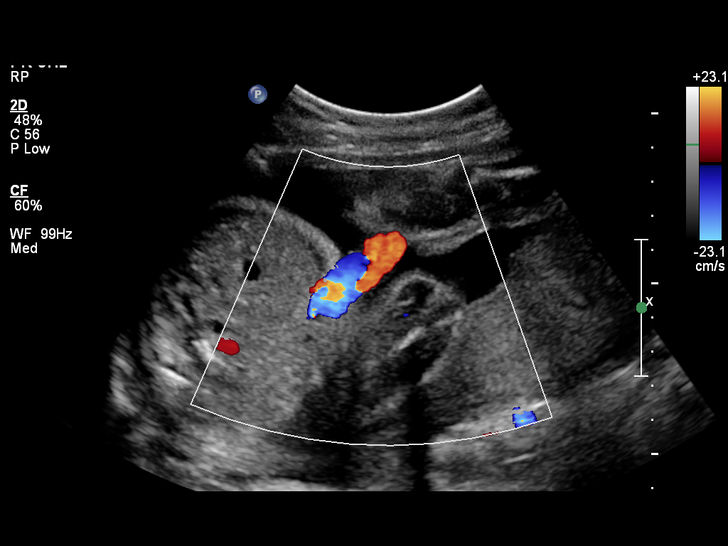
[im 51/57]
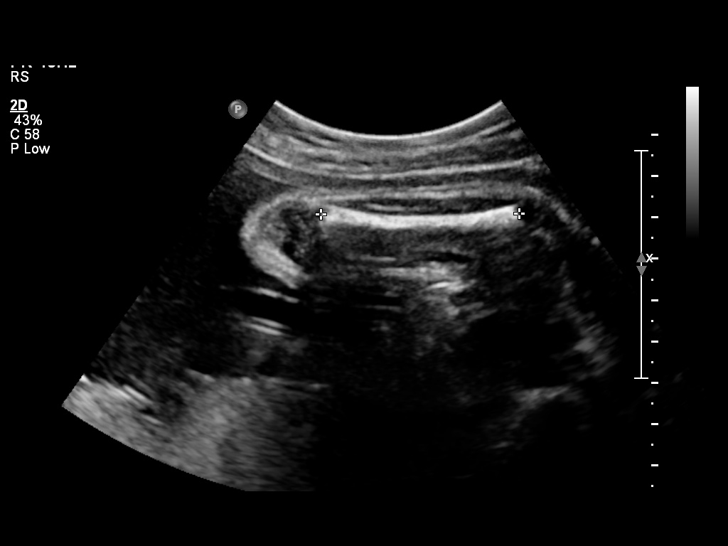

[Series 1: us ob detail +14 wk · 3 of 15 slices shown (2 of 2)]
[im 1/15]
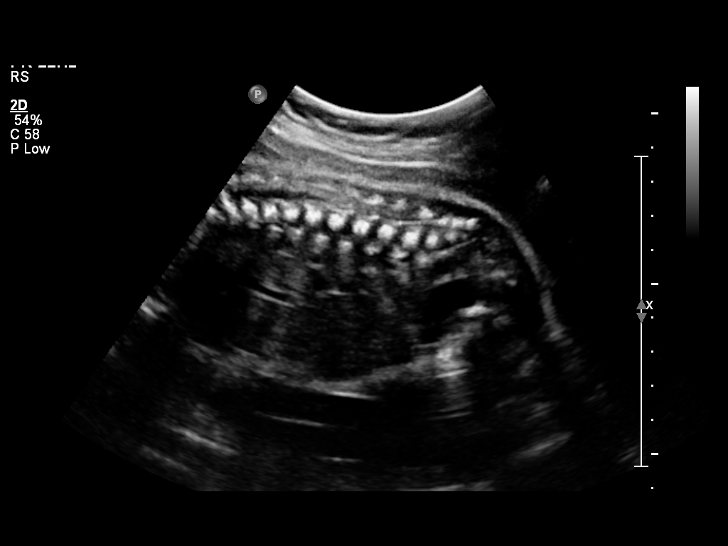
[im 6/15]
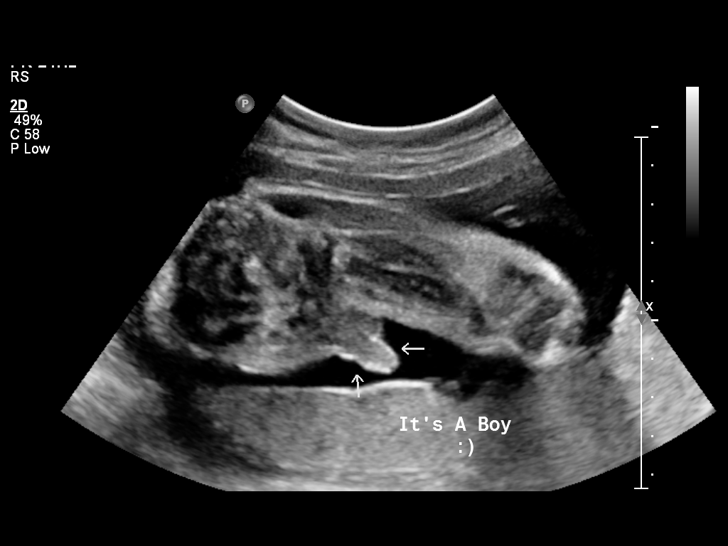
[im 12/15]
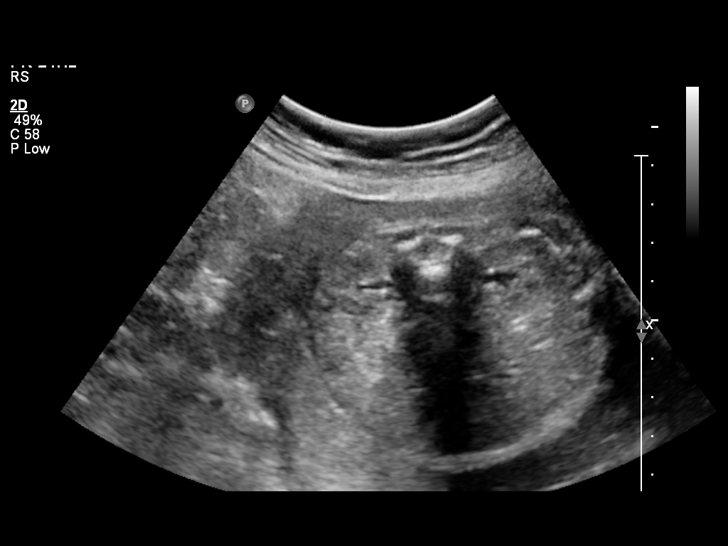

[12 of 28 positions shown; findings below may reference images not displayed]

OBSTETRICS REPORT
                      (Signed Final 11/05/2013 [DATE])

Service(s) Provided

 US OB DETAIL + 14 WK                                  76811.0
Indications

 Detailed fetal anatomic survey
 No or Little Prenatal Care
Fetal Evaluation

 Num Of Fetuses:    1
 Fetal Heart Rate:  153                          bpm
 Cardiac Activity:  Observed
 Presentation:      Breech
 Placenta:          Posterior, above cervical
                    os
 P. Cord            Visualized
 Insertion:

 Amniotic Fluid
 AFI FV:      Subjectively within normal limits
Biometry

 BPD:     64.3  mm     G. Age:  26w 0d                CI:        64.86   70 - 86
                                                      FL/HC:      18.9   18.6 -

 HC:     256.6  mm     G. Age:  27w 6d       85  %    HC/AC:      1.03   1.04 -

 AC:     249.1  mm     G. Age:  29w 1d     > 97  %    FL/BPD:     75.6   71 - 87
 FL:      48.6  mm     G. Age:  26w 2d       46  %    FL/AC:      19.5   20 - 24
 HUM:     46.2  mm     G. Age:  27w 2d       75  %

 Est. FW:    6672  gm      2 lb 8 oz     81  %
Gestational Age

 Clinical EDD:  26w 0d                                        EDD:   02/11/14
 U/S Today:     27w 2d                                        EDD:   02/02/14
 Best:          26w 0d     Det. By:  Clinical EDD             EDD:   02/11/14
Anatomy

 Cranium:          Appears normal         Aortic Arch:      Not well visualized
 Fetal Cavum:      Appears normal         Ductal Arch:      Not well visualized
 Ventricles:       Appears normal         Diaphragm:        Appears normal
 Choroid Plexus:   Appears normal         Stomach:          Appears normal, left
                                                            sided
 Cerebellum:       Appears normal         Abdomen:          Appears normal
 Posterior Fossa:  Appears normal         Abdominal Wall:   Appears nml (cord
                                                            insert, abd wall)
 Nuchal Fold:      Not applicable (>20    Cord Vessels:     Appears normal (3
                   wks GA)                                  vessel cord)
 Face:             Profile appears        Kidneys:          Appear normal
                   normal
 Lips:             Appears normal         Bladder:          Appears normal
 Heart:            Appears normal         Spine:            Appears normal
                   (4CH, axis, and
                   situs)
 RVOT:             Not well visualized    Lower             Appears normal
                                          Extremities:
 LVOT:             Not well visualized    Upper             Appears normal
                                          Extremities:

 Other:  Fetus appears to be a male. Nasal bone visualized. Heels visualized.
         Technically difficult due to fetal position.
Cervix Uterus Adnexa

 Cervical Length:    3        cm

 Cervix:       Normal appearance by transabdominal scan.
 Uterus:       No abnormality visualized.
 Left Ovary:    No adnexal mass visualized.
 Right Ovary:   No adnexal mass visualized.
 Adnexa:     No abnormality visualized.
Impression

 Single IUP at 26 0/7 weeks
 Normal fetal anatomic survey
 Somewhat limited views of the fetal heart obtained due to
 fetal postion
 Estimated fetal weight at the 81st %tile.
 Normal amniotic fluid volume
Recommendations

 Recommend follow-up ultrasound examination in 4 weeks to
 reevaluate the fetal heart (please schedule)

## 2013-12-31 ENCOUNTER — Ambulatory Visit (INDEPENDENT_AMBULATORY_CARE_PROVIDER_SITE_OTHER): Payer: Medicaid Other | Admitting: Family Medicine

## 2013-12-31 VITALS — BP 112/76 | Wt 183.6 lb

## 2013-12-31 DIAGNOSIS — Z3403 Encounter for supervision of normal first pregnancy, third trimester: Secondary | ICD-10-CM

## 2013-12-31 DIAGNOSIS — Z3480 Encounter for supervision of other normal pregnancy, unspecified trimester: Secondary | ICD-10-CM

## 2013-12-31 DIAGNOSIS — Z3483 Encounter for supervision of other normal pregnancy, third trimester: Secondary | ICD-10-CM

## 2013-12-31 DIAGNOSIS — Z3400 Encounter for supervision of normal first pregnancy, unspecified trimester: Secondary | ICD-10-CM

## 2013-12-31 NOTE — Patient Instructions (Signed)
We're going to get an ultrasound to check on vessel anatomy of the heart and also to check the size of the baby Followup with me in 2 weeks. If the pain in the belly gets worse or doesn't get better with resting or Tylenol, please return to care.

## 2013-12-31 NOTE — Progress Notes (Signed)
S: reports abdominal pain along a vein that has appeared on her abdomen. Pain is burning like at times and sometimes just discomfort. Occurs with walking or standing for more than 15minutes. It resolves when she sits and rests.  Otherwise, feeling good fetal movement. No contractions. No vaginal bleeding or loss of fluid.  She has cut back on some of the sweet foods she used to eat. Doesn't eat dates anymore. Reduced number of bananas.  O: abdomen: superficial vein on midline of abdomen, no cord, no tenderness with palpation A/P:  33 yo G4P3003 at 34wga who presents for OB visit - Ultrasound result from 11/06/13 did not completely visualize cardiac anatomy: including aortic arch and ductal arch. Will repeat US for this - weight gain: continues to gain weight above recommended amount. Fundus is measuring over dates. Would like to assess fetal size with US as well. She failed 1hr GTT and had 3hr GTT which was normal (one high value).  - follow up in 2 weeks - superficial vein: continue to monitor. Patient to return if worsening pain.

## 2014-01-01 ENCOUNTER — Other Ambulatory Visit: Payer: Medicaid Other

## 2014-01-07 ENCOUNTER — Ambulatory Visit (HOSPITAL_COMMUNITY)
Admission: RE | Admit: 2014-01-07 | Discharge: 2014-01-07 | Disposition: A | Discharge status: Home / Self Care | Payer: Medicaid Other | Source: Ambulatory Visit | Attending: Family Medicine | Admitting: Family Medicine

## 2014-01-07 ENCOUNTER — Other Ambulatory Visit: Payer: Self-pay | Admitting: Family Medicine

## 2014-01-07 DIAGNOSIS — IMO0002 Reserved for concepts with insufficient information to code with codable children: Secondary | ICD-10-CM

## 2014-01-07 DIAGNOSIS — Z0489 Encounter for examination and observation for other specified reasons: Secondary | ICD-10-CM

## 2014-01-07 DIAGNOSIS — Z3689 Encounter for other specified antenatal screening: Secondary | ICD-10-CM | POA: Insufficient documentation

## 2014-01-07 DIAGNOSIS — Z3483 Encounter for supervision of other normal pregnancy, third trimester: Secondary | ICD-10-CM

## 2014-01-08 ENCOUNTER — Telehealth: Payer: Self-pay | Admitting: Family Medicine

## 2014-01-08 DIAGNOSIS — Z3480 Encounter for supervision of other normal pregnancy, unspecified trimester: Secondary | ICD-10-CM

## 2014-01-08 NOTE — Telephone Encounter (Signed)
[facility]  Unable to reach pt, will try again.  Ladarius Seubert, Darlyne RussianKristen L, CMA

## 2014-01-08 NOTE — Telephone Encounter (Signed)
Please schedule limited OB ultrasound for AFI to rule out polyhydramnios in 1 week. Patient had ultrasound done on 01/07/14 which recommended repeat limited ultrasound in 1 week. Thank you!  Marena ChancyStephanie Sydney Hasten, PGY-3 Family Medicine Resident

## 2014-01-11 NOTE — Telephone Encounter (Signed)
Gave pt appt time for ultrasound. She says she needs an afternoon appt Please advise

## 2014-01-11 NOTE — Telephone Encounter (Signed)
attempted to reach patient again, LMVM.  Nonnie Pickney, Darlyne RussianKristen L, CMA

## 2014-01-11 NOTE — Telephone Encounter (Signed)
[facility] rescheduled pts appt.  Pt notified.  Sheila Gonzales, Sheila Gonzales, CMA

## 2014-01-13 ENCOUNTER — Ambulatory Visit (INDEPENDENT_AMBULATORY_CARE_PROVIDER_SITE_OTHER): Payer: Medicaid Other | Admitting: Family Medicine

## 2014-01-13 ENCOUNTER — Other Ambulatory Visit (HOSPITAL_COMMUNITY)
Admission: RE | Admit: 2014-01-13 | Discharge: 2014-01-13 | Disposition: A | Discharge status: Home / Self Care | Payer: Medicaid Other | Source: Ambulatory Visit | Attending: Family Medicine | Admitting: Family Medicine

## 2014-01-13 ENCOUNTER — Ambulatory Visit (HOSPITAL_COMMUNITY): Payer: Medicaid Other

## 2014-01-13 VITALS — BP 124/74 | Temp 98.3°F | Wt 188.0 lb

## 2014-01-13 DIAGNOSIS — Z113 Encounter for screening for infections with a predominantly sexual mode of transmission: Secondary | ICD-10-CM | POA: Insufficient documentation

## 2014-01-13 DIAGNOSIS — Z3480 Encounter for supervision of other normal pregnancy, unspecified trimester: Secondary | ICD-10-CM

## 2014-01-13 NOTE — Patient Instructions (Signed)
Follow up in 1 week  Third Trimester of Pregnancy The third trimester is from week 29 through week 42, months 7 through 9. The third trimester is a time when the fetus is growing rapidly. At the end of the ninth month, the fetus is about 20 inches in length and weighs 6 10 pounds.  BODY CHANGES Your body goes through many changes during pregnancy. The changes vary from woman to woman.   Your weight will continue to increase. You can expect to gain 25 35 pounds (11 16 kg) by the end of the pregnancy.  You may begin to get stretch marks on your hips, abdomen, and breasts.  You may urinate more often because the fetus is moving lower into your pelvis and pressing on your bladder.  You may develop or continue to have heartburn as a result of your pregnancy.  You may develop constipation because certain hormones are causing the muscles that push waste through your intestines to slow down.  You may develop hemorrhoids or swollen, bulging veins (varicose veins).  You may have pelvic pain because of the weight gain and pregnancy hormones relaxing your joints between the bones in your pelvis. Back aches may result from over exertion of the muscles supporting your posture.  Your breasts will continue to grow and be tender. A yellow discharge may leak from your breasts called colostrum.  Your belly button may stick out.  You may feel short of breath because of your expanding uterus.  You may notice the fetus "dropping," or moving lower in your abdomen.  You may have a bloody mucus discharge. This usually occurs a few days to a week before labor begins.  Your cervix becomes thin and soft (effaced) near your due date. WHAT TO EXPECT AT YOUR PRENATAL EXAMS  You will have prenatal exams every 2 weeks until week 36. Then, you will have weekly prenatal exams. During a routine prenatal visit:  You will be weighed to make sure you and the fetus are growing normally.  Your blood pressure is  taken.  Your abdomen will be measured to track your baby's growth.  The fetal heartbeat will be listened to.  Any test results from the previous visit will be discussed.  You may have a cervical check near your due date to see if you have effaced. At around 36 weeks, your caregiver will check your cervix. At the same time, your caregiver will also perform a test on the secretions of the vaginal tissue. This test is to determine if a type of bacteria, Group B streptococcus, is present. Your caregiver will explain this further. Your caregiver may ask you:  What your birth plan is.  How you are feeling.  If you are feeling the baby move.  If you have had any abnormal symptoms, such as leaking fluid, bleeding, severe headaches, or abdominal cramping.  If you have any questions. Other tests or screenings that may be performed during your third trimester include:  Blood tests that check for low iron levels (anemia).  Fetal testing to check the health, activity level, and growth of the fetus. Testing is done if you have certain medical conditions or if there are problems during the pregnancy. FALSE LABOR You may feel small, irregular contractions that eventually go away. These are called Braxton Hicks contractions, or false labor. Contractions may last for hours, days, or even weeks before true labor sets in. If contractions come at regular intervals, intensify, or become painful, it is best to  be seen by your caregiver.  SIGNS OF LABOR   Menstrual-like cramps.  Contractions that are 5 minutes apart or less.  Contractions that start on the top of the uterus and spread down to the lower abdomen and back.  A sense of increased pelvic pressure or back pain.  A watery or bloody mucus discharge that comes from the vagina. If you have any of these signs before the 37th week of pregnancy, call your caregiver right away. You need to go to the hospital to get checked immediately. HOME CARE  INSTRUCTIONS   Avoid all smoking, herbs, alcohol, and unprescribed drugs. These chemicals affect the formation and growth of the baby.  Follow your caregiver's instructions regarding medicine use. There are medicines that are either safe or unsafe to take during pregnancy.  Exercise only as directed by your caregiver. Experiencing uterine cramps is a good sign to stop exercising.  Continue to eat regular, healthy meals.  Wear a good support bra for breast tenderness.  Do not use hot tubs, steam rooms, or saunas.  Wear your seat belt at all times when driving.  Avoid raw meat, uncooked cheese, cat litter boxes, and soil used by cats. These carry germs that can cause birth defects in the baby.  Take your prenatal vitamins.  Try taking a stool softener (if your caregiver approves) if you develop constipation. Eat more high-fiber foods, such as fresh vegetables or fruit and whole grains. Drink plenty of fluids to keep your urine clear or pale yellow.  Take warm sitz baths to soothe any pain or discomfort caused by hemorrhoids. Use hemorrhoid cream if your caregiver approves.  If you develop varicose veins, wear support hose. Elevate your feet for 15 minutes, 3 4 times a day. Limit salt in your diet.  Avoid heavy lifting, wear low heal shoes, and practice good posture.  Rest a lot with your legs elevated if you have leg cramps or low back pain.  Visit your dentist if you have not gone during your pregnancy. Use a soft toothbrush to brush your teeth and be gentle when you floss.  A sexual relationship may be continued unless your caregiver directs you otherwise.  Do not travel far distances unless it is absolutely necessary and only with the approval of your caregiver.  Take prenatal classes to understand, practice, and ask questions about the labor and delivery.  Make a trial run to the hospital.  Pack your hospital bag.  Prepare the baby's nursery.  Continue to go to all your  prenatal visits as directed by your caregiver. SEEK MEDICAL CARE IF:  You are unsure if you are in labor or if your water has broken.  You have dizziness.  You have mild pelvic cramps, pelvic pressure, or nagging pain in your abdominal area.  You have persistent nausea, vomiting, or diarrhea.  You have a bad smelling vaginal discharge.  You have pain with urination. SEEK IMMEDIATE MEDICAL CARE IF:   You have a fever.  You are leaking fluid from your vagina.  You have spotting or bleeding from your vagina.  You have severe abdominal cramping or pain.  You have rapid weight loss or gain.  You have shortness of breath with chest pain.  You notice sudden or extreme swelling of your face, hands, ankles, feet, or legs.  You have not felt your baby move in over an hour.  You have severe headaches that do not go away with medicine.  You have vision changes. Document Released:  12/04/2001 Document Revised: 08/12/2013 Document Reviewed: 02/10/2013 [facility] Patient Information 2014 [place], [place].

## 2014-01-13 NOTE — Progress Notes (Signed)
S: she is accompanied today by her husband. reports some increased pressure in lower abdomen. Some feeling of belly tightening especially with standing, multiple times per day. Feeling good fetal movement. No vaginal bleeding, no leaking of fluid.  She had ultrasound done for evaluation of growth and US showed subjectively increased amniotic fluid level compared to previous with fluid at 94th percentile and largest pocket at 6.65 cm.  O:  Pelvic exam: normal external genitalia, vulva, vagina, cervix, gravid uterus, scant discharge A/P:  33 yo G4P3003 at 35.6wga who presents for routine prenatal visit.  - GBS, GC/Chl obtained today - irritability without cervical change per exam - recent ultrasound showing increased amniotic fluid level. Will repeat ultrasound for AFI next week - patient to follow up with me in 1 week.  - preterm labor precautions reviewed - increased maternal weight. Decrease amounts of bread eaten. Continue fruits and vegetables.

## 2014-01-15 LAB — STREP B DNA PROBE: GBSP: NEGATIVE

## 2014-01-20 ENCOUNTER — Ambulatory Visit (HOSPITAL_COMMUNITY)
Admission: RE | Admit: 2014-01-20 | Discharge: 2014-01-20 | Disposition: A | Discharge status: Home / Self Care | Payer: Medicaid Other | Source: Ambulatory Visit | Attending: Family Medicine | Admitting: Family Medicine

## 2014-01-20 ENCOUNTER — Other Ambulatory Visit: Payer: Self-pay | Admitting: Family Medicine

## 2014-01-20 DIAGNOSIS — Z3689 Encounter for other specified antenatal screening: Secondary | ICD-10-CM | POA: Insufficient documentation

## 2014-01-20 DIAGNOSIS — Z3480 Encounter for supervision of other normal pregnancy, unspecified trimester: Secondary | ICD-10-CM

## 2014-01-20 DIAGNOSIS — O0930 Supervision of pregnancy with insufficient antenatal care, unspecified trimester: Secondary | ICD-10-CM | POA: Insufficient documentation

## 2014-01-26 ENCOUNTER — Ambulatory Visit (INDEPENDENT_AMBULATORY_CARE_PROVIDER_SITE_OTHER): Payer: Medicaid Other | Admitting: Family Medicine

## 2014-01-26 VITALS — BP 116/74 | Temp 98.3°F | Wt 190.0 lb

## 2014-01-26 DIAGNOSIS — Z3480 Encounter for supervision of other normal pregnancy, unspecified trimester: Secondary | ICD-10-CM

## 2014-01-26 NOTE — Patient Instructions (Signed)
Third Trimester of Pregnancy  The third trimester is from week 29 through week 42, months 7 through 9. The third trimester is a time when the fetus is growing rapidly. At the end of the ninth month, the fetus is about 20 inches in length and weighs 6 10 pounds.   BODY CHANGES  Your body goes through many changes during pregnancy. The changes vary from woman to woman.    Your weight will continue to increase. You can expect to gain 25 35 pounds (11 16 kg) by the end of the pregnancy.   You may begin to get stretch marks on your hips, abdomen, and breasts.   You may urinate more often because the fetus is moving lower into your pelvis and pressing on your bladder.   You may develop or continue to have heartburn as a result of your pregnancy.   You may develop constipation because certain hormones are causing the muscles that push waste through your intestines to slow down.   You may develop hemorrhoids or swollen, bulging veins (varicose veins).   You may have pelvic pain because of the weight gain and pregnancy hormones relaxing your joints between the bones in your pelvis. Back aches may result from over exertion of the muscles supporting your posture.   Your breasts will continue to grow and be tender. A yellow discharge may leak from your breasts called colostrum.   Your belly button may stick out.   You may feel short of breath because of your expanding uterus.   You may notice the fetus "dropping," or moving lower in your abdomen.   You may have a bloody mucus discharge. This usually occurs a few days to a week before labor begins.   Your cervix becomes thin and soft (effaced) near your due date.  WHAT TO EXPECT AT YOUR PRENATAL EXAMS   You will have prenatal exams every 2 weeks until week 36. Then, you will have weekly prenatal exams. During a routine prenatal visit:   You will be weighed to make sure you and the fetus are growing normally.   Your blood pressure is taken.   Your abdomen will be  measured to track your baby's growth.   The fetal heartbeat will be listened to.   Any test results from the previous visit will be discussed.   You may have a cervical check near your due date to see if you have effaced.  At around 36 weeks, your caregiver will check your cervix. At the same time, your caregiver will also perform a test on the secretions of the vaginal tissue. This test is to determine if a type of bacteria, Group B streptococcus, is present. Your caregiver will explain this further.  Your caregiver may ask you:   What your birth plan is.   How you are feeling.   If you are feeling the baby move.   If you have had any abnormal symptoms, such as leaking fluid, bleeding, severe headaches, or abdominal cramping.   If you have any questions.  Other tests or screenings that may be performed during your third trimester include:   Blood tests that check for low iron levels (anemia).   Fetal testing to check the health, activity level, and growth of the fetus. Testing is done if you have certain medical conditions or if there are problems during the pregnancy.  FALSE LABOR  You may feel small, irregular contractions that eventually go away. These are called Braxton Hicks contractions, or   false labor. Contractions may last for hours, days, or even weeks before true labor sets in. If contractions come at regular intervals, intensify, or become painful, it is best to be seen by your caregiver.   SIGNS OF LABOR    Menstrual-like cramps.   Contractions that are 5 minutes apart or less.   Contractions that start on the top of the uterus and spread down to the lower abdomen and back.   A sense of increased pelvic pressure or back pain.   A watery or bloody mucus discharge that comes from the vagina.  If you have any of these signs before the 37th week of pregnancy, call your caregiver right away. You need to go to the hospital to get checked immediately.  HOME CARE INSTRUCTIONS    Avoid all  smoking, herbs, alcohol, and unprescribed drugs. These chemicals affect the formation and growth of the baby.   Follow your caregiver's instructions regarding medicine use. There are medicines that are either safe or unsafe to take during pregnancy.   Exercise only as directed by your caregiver. Experiencing uterine cramps is a good sign to stop exercising.   Continue to eat regular, healthy meals.   Wear a good support bra for breast tenderness.   Do not use hot tubs, steam rooms, or saunas.   Wear your seat belt at all times when driving.   Avoid raw meat, uncooked cheese, cat litter boxes, and soil used by cats. These carry germs that can cause birth defects in the baby.   Take your prenatal vitamins.   Try taking a stool softener (if your caregiver approves) if you develop constipation. Eat more high-fiber foods, such as fresh vegetables or fruit and whole grains. Drink plenty of fluids to keep your urine clear or pale yellow.   Take warm sitz baths to soothe any pain or discomfort caused by hemorrhoids. Use hemorrhoid cream if your caregiver approves.   If you develop varicose veins, wear support hose. Elevate your feet for 15 minutes, 3 4 times a day. Limit salt in your diet.   Avoid heavy lifting, wear low heal shoes, and practice good posture.   Rest a lot with your legs elevated if you have leg cramps or low back pain.   Visit your dentist if you have not gone during your pregnancy. Use a soft toothbrush to brush your teeth and be gentle when you floss.   A sexual relationship may be continued unless your caregiver directs you otherwise.   Do not travel far distances unless it is absolutely necessary and only with the approval of your caregiver.   Take prenatal classes to understand, practice, and ask questions about the labor and delivery.   Make a trial run to the hospital.   Pack your hospital bag.   Prepare the baby's nursery.   Continue to go to all your prenatal visits as directed  by your caregiver.  SEEK MEDICAL CARE IF:   You are unsure if you are in labor or if your water has broken.   You have dizziness.   You have mild pelvic cramps, pelvic pressure, or nagging pain in your abdominal area.   You have persistent nausea, vomiting, or diarrhea.   You have a bad smelling vaginal discharge.   You have pain with urination.  SEEK IMMEDIATE MEDICAL CARE IF:    You have a fever.   You are leaking fluid from your vagina.   You have spotting or bleeding from your vagina.     You have severe abdominal cramping or pain.   You have rapid weight loss or gain.   You have shortness of breath with chest pain.   You notice sudden or extreme swelling of your face, hands, ankles, feet, or legs.   You have not felt your baby move in over an hour.   You have severe headaches that do not go away with medicine.   You have vision changes.  Document Released: 12/04/2001 Document Revised: 08/12/2013 Document Reviewed: 02/10/2013  [employer] Patient Information 2014 [employer].

## 2014-01-26 NOTE — Progress Notes (Signed)
S: feeling lower abdominal pressure with pain around cervix. Feeling a lot of fetal movement. No loss of fluid. No vaginal bleeding. Some white/yello discharge. Some tightening of the abdomen with walking mostly.  O: see flowsheet A/P:  33 yo G4P3003 at 37.5wga who presents for routine OB follow up.  - repeat ultrasound assessing for AFI and BPP showing mild polyhydramnios with largest pocket of 7cm2 and total AFI of 25cm2. Recommendation to follow up with weekly AFI and BPP. Order placed. Please also assess fetal weight and size since measurements have been inconsistent.  - GBS negative - paperwork for her husband to take leave of absence after birth of baby done - follow up in 1 week

## 2014-01-28 ENCOUNTER — Ambulatory Visit (INDEPENDENT_AMBULATORY_CARE_PROVIDER_SITE_OTHER): Payer: Medicaid Other | Admitting: *Deleted

## 2014-01-28 VITALS — BP 111/62

## 2014-01-28 DIAGNOSIS — O409XX0 Polyhydramnios, unspecified trimester, not applicable or unspecified: Secondary | ICD-10-CM

## 2014-01-28 NOTE — Progress Notes (Signed)
P = 93  US for growth/AFI scheduled 2/10

## 2014-02-02 ENCOUNTER — Ambulatory Visit (HOSPITAL_COMMUNITY)
Admission: RE | Admit: 2014-02-02 | Discharge: 2014-02-02 | Disposition: A | Discharge status: Home / Self Care | Payer: Medicaid Other | Source: Ambulatory Visit | Attending: Obstetrics & Gynecology | Admitting: Obstetrics & Gynecology

## 2014-02-02 ENCOUNTER — Ambulatory Visit (INDEPENDENT_AMBULATORY_CARE_PROVIDER_SITE_OTHER): Payer: Medicaid Other | Admitting: *Deleted

## 2014-02-02 VITALS — BP 118/64

## 2014-02-02 DIAGNOSIS — O409XX0 Polyhydramnios, unspecified trimester, not applicable or unspecified: Secondary | ICD-10-CM | POA: Insufficient documentation

## 2014-02-02 NOTE — Progress Notes (Signed)
P = 91   Pt reports UC's are getting stronger.   Pt has US for growth today.

## 2014-02-03 ENCOUNTER — Encounter: Payer: Self-pay | Admitting: Family Medicine

## 2014-02-03 ENCOUNTER — Telehealth: Payer: Self-pay | Admitting: *Deleted

## 2014-02-03 ENCOUNTER — Ambulatory Visit (INDEPENDENT_AMBULATORY_CARE_PROVIDER_SITE_OTHER): Payer: Medicaid Other | Admitting: Family Medicine

## 2014-02-03 VITALS — BP 114/72 | Temp 98.3°F | Wt 192.0 lb

## 2014-02-03 DIAGNOSIS — Z3480 Encounter for supervision of other normal pregnancy, unspecified trimester: Secondary | ICD-10-CM

## 2014-02-03 NOTE — Progress Notes (Signed)
S: feeling pressure. Feeling contractions throughout the day and night. Irregular and intermittent. No loss of fluid. No vaginal bleeding.  O: see flowsheet A/P:  33 yo G4P3003 at 38.6wga who presents for routine prenatal visit.  - US results: patient had AFI's done on 02/02/14 to track polyhydramnios. Polyhydramnios has resolved but fetal weight has increased from last fetal weight assessment (on 01/07/14 was 77th percentile) to >90th percentile weight and AC>97th percentile. [facility]. Patient had 3hr glucola which had one elevated value.  - cervical exam has changed since last visit. Not yet in active labor. Labor precautions reviewed with patient.

## 2014-02-03 NOTE — Telephone Encounter (Signed)
LMVM for patient.  She has [facility] tomorrow, Thursday, 02/04/2011 [facility]  Samentha Perham, Darlyne RussianKristen L, CMA

## 2014-02-04 ENCOUNTER — Encounter: Payer: Self-pay | Admitting: *Deleted

## 2014-02-04 ENCOUNTER — Other Ambulatory Visit: Payer: Self-pay | Admitting: *Deleted

## 2014-02-04 ENCOUNTER — Telehealth: Payer: Self-pay | Admitting: *Deleted

## 2014-02-04 ENCOUNTER — Encounter: Payer: Self-pay | Admitting: Obstetrics and Gynecology

## 2014-02-04 ENCOUNTER — Ambulatory Visit (INDEPENDENT_AMBULATORY_CARE_PROVIDER_SITE_OTHER): Payer: Medicaid Other | Admitting: Obstetrics and Gynecology

## 2014-02-04 VITALS — BP 119/71

## 2014-02-04 DIAGNOSIS — O409XX0 Polyhydramnios, unspecified trimester, not applicable or unspecified: Secondary | ICD-10-CM

## 2014-02-04 DIAGNOSIS — O403XX0 Polyhydramnios, third trimester, not applicable or unspecified: Secondary | ICD-10-CM

## 2014-02-04 NOTE — Telephone Encounter (Signed)
[facility]  She found patients appt, it was never put in the sytem.  Since patient is still there and has a NST today, Antoinette spoke with Dr.Arnold and they assured me patient would be seen by a provider TODAY.  Shloime Keilman, Darlyne RussianKristen L, CMA

## 2014-02-04 NOTE — Telephone Encounter (Signed)
This appt was never scheduled, per [facility]. [facility]. Please call her back.

## 2014-02-04 NOTE — Telephone Encounter (Signed)
Called Surya with [employer] to notify of appointments: MFM ultrasound 02/08/14 at 7:30, and ob fu 02/08/14 0845. Unable to leave message.

## 2014-02-04 NOTE — Progress Notes (Signed)
Pulse- 92 Patient reports irregular contractions and a lot of pelvic pressure

## 2014-02-04 NOTE — Progress Notes (Signed)
02/03/2014 EFW 4272 gm (>90%tile) NST reviewed and reactive Discussed ultrasound report with patient regarding LGA. Patient reports last infant delivered vaginally measured 9lb16oz without complications. Patient denies ever hearing about shoulder dystocia.  Cervical exam unchanged. Membranes stripped. Discussed shoulder dystocia with patient FM/labor precautions reviewed

## 2014-02-05 ENCOUNTER — Telehealth (HOSPITAL_COMMUNITY): Payer: Self-pay | Admitting: *Deleted

## 2014-02-05 ENCOUNTER — Telehealth: Payer: Self-pay | Admitting: *Deleted

## 2014-02-05 ENCOUNTER — Telehealth: Payer: Self-pay | Admitting: Family Medicine

## 2014-02-05 NOTE — Telephone Encounter (Signed)
Precepted with Dr. Armen PickupFunches, advised that [facility] for [facility].  [facility] did not [facility], insisted on being seen her in clinic.  Advised pt that she could have to wait/ be worked in if wanted to be seen in clinic.  Advised pt again if she is having pressure/ contraction that she need to go to Maternity Admission.  Pt stated that it was an emergency and that she wanted to see provider here in clinic.  Pt was not in distress, pt asked how far along was contractions, pt stated it is pressure and I saw mucous discharge. Pt stated she was 3 cm, yesterday and they stripped her membranes yesterday at a different appointment.  Pt walked out of triage nurse office.  Clovis PuMartin, Gagandeep Pettet L, RN

## 2014-02-05 NOTE — Telephone Encounter (Signed)
Pt called clinic this AM requesting to be seen by PCP having pressure and per pt saw large mucous from vagina.  Clerical staff [facility]  Pt insisted on coming into clinic.  Pt walked into clinic, seen by triage nurse.  Pt told triage nurse she wanted to be seen by a provider here in clinic.  Pt asked if she was having contractions, pt stated "I have been having contractions for 2 weeks now.  I am having pressure and I have mucous discharge.

## 2014-02-05 NOTE — Telephone Encounter (Signed)
Preadmission screen  

## 2014-02-05 NOTE — Telephone Encounter (Addendum)
  Called Sheila Gonzales again with [employer]- unable to leave a message - no answer. Called significant other - also unable to leave a message.

## 2014-02-05 NOTE — Telephone Encounter (Signed)
Called Dr. Macon LargeAnyanwu regarding patient and increased fetal weight. Patient discussed case with Dr. Debroah LoopArnold who will be attending on Monday 02/08/14 and he agreed to admit patient that morning for elective IOL for fetus large for gestational age.   I called patient and spoke with her directly asking her if she would be willing to be induced and she agreed to plan. I told her 7am on Monday January 16th.  She also tells me that she had some bloody mucous yesterday and this morning. She tells me that [facility] and [facility]. Once there, she was told she was not able to be seen. She reported not being happy about this. I told her I was sorry for her experience.  She tells me that she has worsening pressure compared to yesterday. She also said that in her previous pregnancies, she never felt true contractions until she had [facility]. I told her that since she is having more pressure than yesterday, that it would be reasonable go to the MAU to be checked and make sure that there hasn't been anymore cervical change. She agreed to this.   [facility] and scheduled induction for 02/08/14 at 7am.   Marena ChancyStephanie Claudene Gatliff, PGY-3 Family Medicine Resident

## 2014-02-08 ENCOUNTER — Inpatient Hospital Stay (HOSPITAL_COMMUNITY)
Admission: RE | Admit: 2014-02-08 | Discharge: 2014-02-10 | DRG: 775 | Disposition: A | Discharge status: Home / Self Care | Payer: Medicaid Other | Source: Ambulatory Visit | Attending: Obstetrics & Gynecology | Admitting: Obstetrics & Gynecology

## 2014-02-08 ENCOUNTER — Encounter: Payer: Medicaid Other | Admitting: Family Medicine

## 2014-02-08 ENCOUNTER — Encounter (HOSPITAL_COMMUNITY): Payer: Self-pay

## 2014-02-08 ENCOUNTER — Ambulatory Visit (HOSPITAL_COMMUNITY): Payer: Medicaid Other

## 2014-02-08 VITALS — BP 107/73 | HR 82 | Temp 98.1°F | Resp 16 | Ht 64.5 in | Wt 192.0 lb

## 2014-02-08 DIAGNOSIS — K644 Residual hemorrhoidal skin tags: Secondary | ICD-10-CM | POA: Diagnosis present

## 2014-02-08 DIAGNOSIS — O878 Other venous complications in the puerperium: Secondary | ICD-10-CM | POA: Diagnosis present

## 2014-02-08 DIAGNOSIS — O3660X0 Maternal care for excessive fetal growth, unspecified trimester, not applicable or unspecified: Principal | ICD-10-CM | POA: Diagnosis present

## 2014-02-08 DIAGNOSIS — O9903 Anemia complicating the puerperium: Secondary | ICD-10-CM | POA: Diagnosis not present

## 2014-02-08 DIAGNOSIS — O99892 Other specified diseases and conditions complicating childbirth: Secondary | ICD-10-CM | POA: Diagnosis present

## 2014-02-08 DIAGNOSIS — D649 Anemia, unspecified: Secondary | ICD-10-CM | POA: Diagnosis not present

## 2014-02-08 DIAGNOSIS — K5900 Constipation, unspecified: Secondary | ICD-10-CM | POA: Diagnosis present

## 2014-02-08 DIAGNOSIS — Z3480 Encounter for supervision of other normal pregnancy, unspecified trimester: Secondary | ICD-10-CM

## 2014-02-08 DIAGNOSIS — O9989 Other specified diseases and conditions complicating pregnancy, childbirth and the puerperium: Secondary | ICD-10-CM

## 2014-02-08 LAB — TYPE AND SCREEN
ABO/RH(D): A POS
ANTIBODY SCREEN: NEGATIVE

## 2014-02-08 LAB — CBC
HCT: 29.6 % — ABNORMAL LOW (ref 36.0–46.0)
HEMOGLOBIN: 9.4 g/dL — AB (ref 12.0–15.0)
MCH: 22.8 pg — ABNORMAL LOW (ref 26.0–34.0)
MCHC: 31.8 g/dL (ref 30.0–36.0)
MCV: 71.7 fL — ABNORMAL LOW (ref 78.0–100.0)
PLATELETS: 208 10*3/uL (ref 150–400)
RBC: 4.13 MIL/uL (ref 3.87–5.11)
RDW: 17.1 % — ABNORMAL HIGH (ref 11.5–15.5)
WBC: 9.4 10*3/uL (ref 4.0–10.5)

## 2014-02-08 LAB — ABO/RH: ABO/RH(D): A POS

## 2014-02-08 LAB — RPR: RPR: NONREACTIVE

## 2014-02-08 MED ORDER — WITCH HAZEL-GLYCERIN EX PADS
1.0000 | MEDICATED_PAD | CUTANEOUS | Status: DC | PRN
Start: 2014-02-08 — End: 2014-02-10

## 2014-02-08 MED ORDER — SIMETHICONE 80 MG PO CHEW
80.0000 mg | CHEWABLE_TABLET | ORAL | Status: DC | PRN
Start: 2014-02-08 — End: 2014-02-10

## 2014-02-08 MED ORDER — LACTATED RINGERS IV SOLN: 500.0000 mL | INTRAVENOUS | Status: DC | PRN

## 2014-02-08 MED ORDER — PHENYLEPHRINE 40 MCG/ML (10ML) SYRINGE FOR IV PUSH (FOR BLOOD PRESSURE SUPPORT)
80.0000 ug | PREFILLED_SYRINGE | INTRAVENOUS | Status: DC | PRN
  Filled 2014-02-08: qty 2

## 2014-02-08 MED ORDER — LANOLIN HYDROUS EX OINT: TOPICAL_OINTMENT | CUTANEOUS | Status: DC | PRN

## 2014-02-08 MED ORDER — OXYTOCIN 40 UNITS IN LACTATED RINGERS INFUSION - SIMPLE MED
1.0000 m[IU]/min | INTRAVENOUS | Status: DC
  Administered 2014-02-08: 2 m[IU]/min via INTRAVENOUS
  Filled 2014-02-08: qty 1000

## 2014-02-08 MED ORDER — IBUPROFEN 600 MG PO TABS
600.0000 mg | ORAL_TABLET | Freq: Four times a day (QID) | ORAL | Status: DC
  Administered 2014-02-08 – 2014-02-09 (×6): 600 mg via ORAL
  Filled 2014-02-08 (×7): qty 1

## 2014-02-08 MED ORDER — EPHEDRINE 5 MG/ML INJ
10.0000 mg | INTRAVENOUS | Status: DC | PRN
  Filled 2014-02-08: qty 2

## 2014-02-08 MED ORDER — BENZOCAINE-MENTHOL 20-0.5 % EX AERO
1.0000 | INHALATION_SPRAY | CUTANEOUS | Status: DC | PRN
Start: 2014-02-08 — End: 2014-02-10

## 2014-02-08 MED ORDER — OXYTOCIN BOLUS FROM INFUSION: 500.0000 mL | INTRAVENOUS | Status: DC

## 2014-02-08 MED ORDER — LACTATED RINGERS IV SOLN: 500.0000 mL | Freq: Once | INTRAVENOUS | Status: DC

## 2014-02-08 MED ORDER — ONDANSETRON HCL 4 MG PO TABS: 4.0000 mg | ORAL_TABLET | ORAL | Status: DC | PRN

## 2014-02-08 MED ORDER — LACTATED RINGERS IV SOLN
INTRAVENOUS | Status: DC
  Administered 2014-02-08: 09:00:00 via INTRAVENOUS

## 2014-02-08 MED ORDER — SENNOSIDES-DOCUSATE SODIUM 8.6-50 MG PO TABS
2.0000 | ORAL_TABLET | ORAL | Status: DC
  Administered 2014-02-08 – 2014-02-09 (×2): 2 via ORAL
  Filled 2014-02-08 (×2): qty 2

## 2014-02-08 MED ORDER — ACETAMINOPHEN 325 MG PO TABS: 650.0000 mg | ORAL_TABLET | ORAL | Status: DC | PRN

## 2014-02-08 MED ORDER — DIPHENHYDRAMINE HCL 25 MG PO CAPS
25.0000 mg | ORAL_CAPSULE | Freq: Four times a day (QID) | ORAL | Status: DC | PRN
Start: 2014-02-08 — End: 2014-02-10

## 2014-02-08 MED ORDER — LIDOCAINE HCL (PF) 1 % IJ SOLN
30.0000 mL | INTRAMUSCULAR | Status: DC | PRN
  Filled 2014-02-08: qty 30

## 2014-02-08 MED ORDER — ONDANSETRON HCL 4 MG/2ML IJ SOLN: 4.0000 mg | Freq: Four times a day (QID) | INTRAMUSCULAR | Status: DC | PRN

## 2014-02-08 MED ORDER — FENTANYL 2.5 MCG/ML BUPIVACAINE 1/10 % EPIDURAL INFUSION (WH - ANES): 14.0000 mL/h | INTRAMUSCULAR | Status: DC | PRN

## 2014-02-08 MED ORDER — PRENATAL MULTIVITAMIN CH
1.0000 | ORAL_TABLET | Freq: Every day | ORAL | Status: DC
  Administered 2014-02-09: 1 via ORAL
  Filled 2014-02-08: qty 1

## 2014-02-08 MED ORDER — IBUPROFEN 600 MG PO TABS: 600.0000 mg | ORAL_TABLET | Freq: Four times a day (QID) | ORAL | Status: DC | PRN

## 2014-02-08 MED ORDER — TERBUTALINE SULFATE 1 MG/ML IJ SOLN: 0.2500 mg | Freq: Once | INTRAMUSCULAR | Status: DC | PRN

## 2014-02-08 MED ORDER — OXYCODONE-ACETAMINOPHEN 5-325 MG PO TABS: 1.0000 | ORAL_TABLET | ORAL | Status: DC | PRN

## 2014-02-08 MED ORDER — ZOLPIDEM TARTRATE 5 MG PO TABS: 5.0000 mg | ORAL_TABLET | Freq: Every evening | ORAL | Status: DC | PRN

## 2014-02-08 MED ORDER — FENTANYL CITRATE 0.05 MG/ML IJ SOLN: 50.0000 ug | INTRAMUSCULAR | Status: DC | PRN

## 2014-02-08 MED ORDER — OXYCODONE-ACETAMINOPHEN 5-325 MG PO TABS
1.0000 | ORAL_TABLET | ORAL | Status: DC | PRN
  Administered 2014-02-08: 1 via ORAL
  Filled 2014-02-08: qty 1

## 2014-02-08 MED ORDER — DIBUCAINE 1 % RE OINT
1.0000 | TOPICAL_OINTMENT | RECTAL | Status: DC | PRN
Start: 2014-02-08 — End: 2014-02-10

## 2014-02-08 MED ORDER — CITRIC ACID-SODIUM CITRATE 334-500 MG/5ML PO SOLN: 30.0000 mL | ORAL | Status: DC | PRN

## 2014-02-08 MED ORDER — SODIUM CHLORIDE 0.9 % IJ SOLN: 3.0000 mL | INTRAMUSCULAR | Status: DC | PRN

## 2014-02-08 MED ORDER — DIPHENHYDRAMINE HCL 50 MG/ML IJ SOLN: 12.5000 mg | INTRAMUSCULAR | Status: DC | PRN

## 2014-02-08 MED ORDER — OXYTOCIN 40 UNITS IN LACTATED RINGERS INFUSION - SIMPLE MED: 62.5000 mL/h | INTRAVENOUS | Status: DC

## 2014-02-08 MED ORDER — ONDANSETRON HCL 4 MG/2ML IJ SOLN: 4.0000 mg | INTRAMUSCULAR | Status: DC | PRN

## 2014-02-08 MED ORDER — FLEET ENEMA 7-19 GM/118ML RE ENEM: 1.0000 | ENEMA | RECTAL | Status: DC | PRN

## 2014-02-08 MED ORDER — TETANUS-DIPHTH-ACELL PERTUSSIS 5-2.5-18.5 LF-MCG/0.5 IM SUSP: 0.5000 mL | Freq: Once | INTRAMUSCULAR | Status: DC

## 2014-02-08 NOTE — Progress Notes (Signed)
I have seen this patient and agree with the above resident's note.  LEFTWICH-KIRBY, Delbert Vu Certified Nurse-Midwife 

## 2014-02-08 NOTE — H&P (Signed)
Subjective:  Vance GatherMeryem Yow is a 33 y.o. G4 P3 female with EDC 02/11/14 at 5039 and 4/[redacted] weeks gestation who is being admitted for induction of labor.  Her current obstetrical history is significant for polyhydramnios that resolved on last US done on 02/03/14. US showed fetal weight: 9.7 lbs, >90th percentile, AC>97th percentile. Elective Induction of Labor was scheduled for patient with borderline 3hr GTT, increased fetal weight and h/o polyhydramnios this pregnancy.      Patient reports pressure. Irregular contractions. No vaginal bleeding, no fluid loss. Fetal Movement: normal.     Objective:   Vital signs in last 24 hours:   Filed Vitals:   02/08/14 0758 02/08/14 0909 02/08/14 0941  BP: 119/75 125/58 122/70  Pulse: 106 89 88  Temp: 99.1 F (37.3 C)    TempSrc: Oral    Resp: 16 16 16   Height: 5' 4.5" (1.638 m)    Weight: 192 lb (87.091 kg)       General:   alert, cooperative and no distress  Skin:   normal  HEENT:  PERRLA and extra ocular movement intact  Lungs:   clear to auscultation bilaterally  Heart:   regular rate and rhythm, S1, S2 normal, no murmur, click, rub or gallop  Breasts:   deferred  Abdomen:  gravid  Pelvis:  Exam deferred.  FHT:  150 BPM  Uterine Size: 40 cm  Presentations: cephalic  Cervix:    Dilation: 4cm   Effacement: 50%   Station:  -2   Consistency: medium   Position: posterior   Lab Review  A, Rh+ GBS negative 01/13/14 HBsAg: negative Varicella: 0.76 Rubella immune HIV: non reactive 10/23/13 RPR: non reactive 10/23/13 Sickle cell: negative 10/23/13 GC/Chl: negative 01/13/14  AFP:too late   One hour GTT: 12/08/13: 181. 3hr glucola: borderline but passed:75/166/162/138     Assessment/Plan:  39.4/[redacted] weeks gestation.  Obstetrical history significant for mild polyhydramnios which resolved on 02/03/14 ultrasound. Also significant for large fetal size >90th percentile.     Risks, benefits, alternatives and possible complications have been  discussed in detail with the patient.  Pre-admission, admission, and post admission procedures and expectations were discussed in detail.  All questions answered, all appropriate consents will be signed at the Hospital. Admission is planned for Elective Induction of Labor with borderline 3hr GTT, increased fetal weight and h/o polyhydramnios this pregnancy.   - start pitocin then AROM - GBS negative - category 1  - expected mode of delivery: vaginal   Marena ChancyStephanie Elizzie Westergard, PGY-3 Family Medicine Resident

## 2014-02-08 NOTE — Progress Notes (Signed)
Sheila Gonzales is a 33 y.o. [id number] at 2349w4d by LMP admitted for induction of labor due to borderline 3hr GTT and large for gestational age fetal weight.  Subjective: Has been walking Objective: BP 127/72  Pulse 112  Temp(Src) 97.8 F (36.6 C) (Oral)  Resp 16  Ht 5' 4.5" (1.638 m)  Wt 192 lb (87.091 kg)  BMI 32.46 kg/m2  LMP 05/07/2013      FHT:  FHR: 140 bpm, variability: moderate,  accelerations:  Present,  decelerations:  Absent UC:   Irregular every 2-3 minutes SVE:   Dilation: 5 Effacement (%): 50 Station: -2 Exam by:: Sharen CounterLisa Leftwich-Kirby CNM  Labs: Lab Results  Component Value Date   WBC 9.4 02/08/2014   HGB 9.4* 02/08/2014   HCT 29.6* 02/08/2014   MCV 71.7* 02/08/2014   PLT 208 02/08/2014    Assessment / Plan: Induction of labor due to borderline 3hr GTT and large for gestational age fetal weight  progressing well on pitocin  Labor: AROM done by Sharen CounterLisa Leftwich-Kirby, clear fluid. Continue pitocin.  Preeclampsia:  none Fetal Wellbeing:  Category I Pain Control:  Labor support without medications I/D:  n/a GBS negative Anticipated MOD:  NSVD  Sheila Gonzales 02/08/2014, 2:33 PM

## 2014-02-08 NOTE — Progress Notes (Signed)
Sheila GatherMeryem Gonzales is a 33 y.o. [id number] at 414w4d by LMP admitted for induction of labor due to borderline 3hr GTT and large for gestational age fetal weight.  Subjective: Feeling more pressure and pain.  Objective: BP 134/83  Pulse 97  Temp(Src) 97.8 F (36.6 C) (Oral)  Resp 16  Ht 5' 4.5" (1.638 m)  Wt 192 lb (87.091 kg)  BMI 32.46 kg/m2  LMP 05/07/2013     FHT:  FHR: 140, moderate variability, variable decels, accels present.  UC:   Regular every 2 minutes.  SVE:   Dilation: 7 Effacement (%): 80 Station: 0 Exam by:: Dr. Gwenlyn SaranLosq  Labs: Lab Results  Component Value Date   WBC 9.4 02/08/2014   HGB 9.4* 02/08/2014   HCT 29.6* 02/08/2014   MCV 71.7* 02/08/2014   PLT 208 02/08/2014    Assessment / Plan: Induction of labor due to borderline 3hr GTT and large for gestational age fetal weight  progressing well on pitocin  Labor: progressing well on pitocin s/p AROM.  Preeclampsia:  none Fetal Wellbeing: Category 2 Pain Control:  Labor support without medications I/D:  n/a GBS negative Anticipated MOD:  NSVD  Treyvonne Tata 02/08/2014, 3:33 PM

## 2014-02-08 NOTE — Progress Notes (Signed)
Sheila Gonzales is a 33 y.o. [id number] at 3364w4d by LMP admitted for induction of labor due to borderline 3hr GTT and large for gestational age fetal weight.  Subjective: Feeling contractions every 4 minutes.  Objective: BP 114/67  Pulse 86  Temp(Src) 98.3 F (36.8 C) (Oral)  Resp 16  Ht 5' 4.5" (1.638 m)  Wt 192 lb (87.091 kg)  BMI 32.46 kg/m2  LMP 05/07/2013      FHT:  FHR: 140 bpm, variability: moderate,  accelerations:  Present,  decelerations:  Absent UC:   irregular, every 4 minutes SVE:   Dilation: 4.5 Effacement (%): 50 Station: -2 Exam by:: Dr. Gwenlyn Gonzales  Labs: Lab Results  Component Value Date   WBC 9.4 02/08/2014   HGB 9.4* 02/08/2014   HCT 29.6* 02/08/2014   MCV 71.7* 02/08/2014   PLT 208 02/08/2014    Assessment / Plan: Induction of labor due to borderline 3hr GTT and large for gestational age fetal weight  progressing well on pitocin  Labor: Progressing on Pitocin, will continue to increase then AROM Preeclampsia:  none Fetal Wellbeing:  Category I Pain Control:  Labor support without medications I/D:  n/a GBS negative Anticipated MOD:  NSVD  Sheila Gonzales 02/08/2014, 1:18 PM

## 2014-02-09 LAB — CBC
HCT: 28.7 % — ABNORMAL LOW (ref 36.0–46.0)
Hemoglobin: 9 g/dL — ABNORMAL LOW (ref 12.0–15.0)
MCH: 22.5 pg — ABNORMAL LOW (ref 26.0–34.0)
MCHC: 31.4 g/dL (ref 30.0–36.0)
MCV: 71.8 fL — ABNORMAL LOW (ref 78.0–100.0)
PLATELETS: 201 10*3/uL (ref 150–400)
RBC: 4 MIL/uL (ref 3.87–5.11)
RDW: 17.3 % — AB (ref 11.5–15.5)
WBC: 11.4 10*3/uL — ABNORMAL HIGH (ref 4.0–10.5)

## 2014-02-09 MED ORDER — FERROUS SULFATE 325 (65 FE) MG PO TABS
325.0000 mg | ORAL_TABLET | Freq: Two times a day (BID) | ORAL | Status: DC
  Administered 2014-02-09 – 2014-02-10 (×3): 325 mg via ORAL
  Filled 2014-02-09 (×3): qty 1

## 2014-02-09 MED ORDER — POLYETHYLENE GLYCOL 3350 17 G PO PACK
17.0000 g | PACK | Freq: Every day | ORAL | Status: DC
  Administered 2014-02-09: 17 g via ORAL
  Filled 2014-02-09 (×2): qty 1

## 2014-02-09 NOTE — Progress Notes (Signed)
UR chart review completed.  

## 2014-02-09 NOTE — Progress Notes (Signed)
Reactive NST on 2/1/0/15 

## 2014-02-09 NOTE — Progress Notes (Signed)
Patient ID: Sheila Gonzales, female   DOB: December 04, 1981, 33 y.o.   MRN: [id number] Post Partum Day 1 Subjective: Up ad lib.  Feeling of burning hemorrhoid. No bowel movement.  Breastfeeding well.   Objective: Blood pressure 109/62, pulse 76, temperature 98.1 F (36.7 C), temperature source Oral, resp. rate 18, height 5' 4.5" (1.638 m), weight 192 lb (87.091 kg), last menstrual period 05/07/2013, unknown if currently breastfeeding.  Physical Exam:  General: alert, cooperative and no distress Lochia: appropriate Uterine Fundus: firm DVT Evaluation: No evidence of DVT seen on physical exam. Negative Homan's sign.   Recent Labs  02/08/14 0910 02/09/14 0618  HGB 9.4* 9.0*  HCT 29.6* 28.7*    Assessment/Plan: G4P4004 delivered at 39.4wga - anemia: start ferrous sulfate bid - constipation and hemorrhoids: sitz baths and miralax and colace - breastfeeding - contraception: abstinence - circumcision: not yet decided. Will give information sheet of offices in community that do circs.  - plan for discharge tomorrow.    LOS: 1 day   Sheila ChancyLOSQ, Sheila Gonzales 02/09/2014, 7:46 AM   I spoke with and examined patient and agree with resident's note and plan of care.  Tawana ScaleMichael Ryan [name], MD OB Fellow 02/09/2014 10:10 AM

## 2014-02-10 ENCOUNTER — Ambulatory Visit (HOSPITAL_COMMUNITY): Payer: Medicaid Other

## 2014-02-10 MED ORDER — POLYETHYLENE GLYCOL 3350 17 G PO PACK: 17.0000 g | PACK | Freq: Every day | ORAL | Status: DC

## 2014-02-10 MED ORDER — SENNOSIDES-DOCUSATE SODIUM 8.6-50 MG PO TABS: 2.0000 | ORAL_TABLET | Freq: Every evening | ORAL | Status: AC | PRN

## 2014-02-10 MED ORDER — FERROUS SULFATE 325 (65 FE) MG PO TABS: 325.0000 mg | ORAL_TABLET | Freq: Two times a day (BID) | ORAL | Status: AC

## 2014-02-10 MED ORDER — POLYETHYLENE GLYCOL 3350 17 GM/SCOOP PO POWD: 1.0000 | Freq: Once | ORAL | Status: AC

## 2014-02-10 NOTE — Discharge Summary (Signed)
Obstetric Discharge Summary 33 yo [phone] at 39.4wga who presented for IOL due to borderline 3hr GTT and large for gestational age fetal weight. Patient also had mild polyhydramnios which resolved on US on 02/03/14.  Reason for Admission: induction of labor Prenatal Procedures: NST, ultrasound and AFI Intrapartum Procedures: spontaneous vaginal delivery Postpartum Procedures: none Complications-Operative and Postpartum: none Hemoglobin  Date Value Ref Range Status  02/09/2014 9.0* 12.0 - 15.0 g/dL Final     HCT  Date Value Ref Range Status  02/09/2014 28.7* 36.0 - 46.0 % Final   24hr course:  Patient with rectal pain, feeling of internal hemorrhoids.  Had a soft bowel movement. Otherwise eating well. Mild nausea that has since resolved since yesterday. Ambulating.  Physical Exam:  General: alert, cooperative and no distress Lochia: appropriate Uterine Fundus: firm DVT Evaluation: No evidence of DVT seen on physical exam. Rectal: exterior exam: no thromboses external hemorrhoid or anal fissure.   Discharge Diagnoses: Term Pregnancy-delivered  Discharge Information: Date: 02/10/2014 Activity: pelvic rest Diet: routine Medications: Ibuprofen, Colace, Iron and miralax Contraception: abstinence Feeding: breastfeeding Circumcision: not in hospital Condition: stable Instructions: refer to practice specific booklet Discharge to: home   Newborn Data: Live born female  Birth Weight: 9 lb 11 oz (4394 g) APGAR: 8, 9  Home with mother.  Sheila Gonzales, Sheila Gonzales 02/10/2014, 8:18 AM  Hospital Course: Sheila Gonzales is a 33 y.o. G4 P3 female with EDC 02/11/14 at 6739 and 4/[redacted] weeks gestation who is being admitted for induction of labor. Her current obstetrical history is significant for polyhydramnios that resolved on last US done on 02/03/14. US showed fetal weight: 9.7 lbs, >90th percentile, AC>97th percentile. Elective Induction of Labor was scheduled for patient with borderline 3hr GTT, increased  fetal weight and h/o polyhydramnios this pregnancy.  Delivery Note  At 3:49 PM a viable female was delivered via Vaginal, Spontaneous Delivery (Presentation: Left Occiput Anterior). Loose nuchal x1. No shoulder dystocia. APGAR: 8, 9; weight . Placenta status: Intact, Spontaneous. Cord: 3 vessels with the following complications: None.  Anesthesia: None  Episiotomy: None  Lacerations: none  Suture Repair: none  Est. Blood Loss (mL): 450  Mom to postpartum. Baby to Couplet care / Skin to Skin.  Sheila Gonzales, Sheila Gonzales  02/08/2014, 4:18 PM  I was present for this delivery and agree with the above resident's note.  Sheila Gonzales  Certified Nurse-Midwife    Seen and examined by me also. Agree with note Will be followed postpartum by Dr Gwenlyn SaranLosq  Sheila Gonzales, CNM

## 2014-02-10 NOTE — Discharge Instructions (Signed)
Postpartum Care After Vaginal Delivery °After you deliver your newborn (postpartum period), the usual stay in the hospital is 24 72 hours. If there were problems with your labor or delivery, or if you have other medical problems, you might be in the hospital longer.  °While you are in the hospital, you will receive help and instructions on how to care for yourself and your newborn during the postpartum period.  °While you are in the hospital: °· Be sure to tell your nurses if you have pain or discomfort, as well as where you feel the pain and what makes the pain worse. °· If you had an incision made near your vagina (episiotomy) or if you had some tearing during delivery, the nurses may put ice packs on your episiotomy or tear. The ice packs may help to reduce the pain and swelling. °· If you are breastfeeding, you may feel uncomfortable contractions of your uterus for a couple of weeks. This is normal. The contractions help your uterus get back to normal size. °· It is normal to have some bleeding after delivery. °· For the first 1 3 days after delivery, the flow is red and the amount may be similar to a period. °· It is common for the flow to start and stop. °· In the first few days, you may pass some small clots. Let your nurses know if you begin to pass large clots or your flow increases. °· Do not  flush blood clots down the toilet before having the nurse look at them. °· During the next 3 10 days after delivery, your flow should become more watery and pink or brown-tinged in color. °· Ten to fourteen days after delivery, your flow should be a small amount of yellowish-white discharge. °· The amount of your flow will decrease over the first few weeks after delivery. Your flow may stop in 6 8 weeks. Most women have had their flow stop by 12 weeks after delivery. °· You should change your sanitary pads frequently. °· Wash your hands thoroughly with soap and water for at least 20 seconds after changing pads, using  the toilet, or before holding or feeding your newborn. °· You should feel like you need to empty your bladder within the first 6 8 hours after delivery. °· In case you become weak, lightheaded, or faint, call your nurse before you get out of bed for the first time and before you take a shower for the first time. °· Within the first few days after delivery, your breasts may begin to feel tender and full. This is called engorgement. Breast tenderness usually goes away within 48 72 hours after engorgement occurs. You may also notice milk leaking from your breasts. If you are not breastfeeding, do not stimulate your breasts. Breast stimulation can make your breasts produce more milk. °· Spending as much time as possible with your newborn is very important. During this time, you and your newborn can feel close and get to know each other. Having your newborn stay in your room (rooming in) will help to strengthen the bond with your newborn.  It will give you time to get to know your newborn and become comfortable caring for your newborn. °· Your hormones change after delivery. Sometimes the hormone changes can temporarily cause you to feel sad or tearful. These feelings should not last more than a few days. If these feelings last longer than that, you should talk to your caregiver. °· If desired, talk to your caregiver about   methods of family planning or contraception.  Talk to your caregiver about immunizations. Your caregiver may want you to have the following immunizations before leaving the hospital:  Tetanus, diphtheria, and pertussis (Tdap) or tetanus and diphtheria (Td) immunization. It is very important that you and your family (including grandparents) or others caring for your newborn are up-to-date with the Tdap or Td immunizations. The Tdap or Td immunization can help protect your newborn from getting ill.  Rubella immunization.  Varicella (chickenpox) immunization.  Influenza immunization. You should  receive this annual immunization if you did not receive the immunization during your pregnancy. Document Released: 10/07/2007 Document Revised: 09/03/2012 Document Reviewed: 08/06/2012 [facility] Patient Information 2014 [place], [place].    Hemorrhoids Hemorrhoids are swollen veins around the rectum or anus. There are two types of hemorrhoids:   Internal hemorrhoids. These occur in the veins just inside the rectum. They may poke through to the outside and become irritated and painful.  External hemorrhoids. These occur in the veins outside the anus and can be felt as a painful swelling or hard lump near the anus. CAUSES  Pregnancy.   Obesity.   Constipation or diarrhea.   Straining to have a bowel movement.   Sitting for long periods on the toilet.  Heavy lifting or other activity that caused you to strain.  Anal intercourse. SYMPTOMS   Pain.   Anal itching or irritation.   Rectal bleeding.   Fecal leakage.   Anal swelling.   One or more lumps around the anus.  DIAGNOSIS  Your caregiver may be able to diagnose hemorrhoids by visual examination. Other examinations or tests that may be performed include:   Examination of the rectal area with a gloved hand (digital rectal exam).   Examination of anal canal using a small tube (scope).   A blood test if you have lost a significant amount of blood.  A test to look inside the colon (sigmoidoscopy or colonoscopy). TREATMENT Most hemorrhoids can be treated at home. However, if symptoms do not seem to be getting better or if you have a lot of rectal bleeding, your caregiver may perform a procedure to help make the hemorrhoids get smaller or remove them completely. Possible treatments include:   Placing a rubber band at the base of the hemorrhoid to cut off the circulation (rubber band ligation).   Injecting a chemical to shrink the hemorrhoid (sclerotherapy).   Using a tool to burn the hemorrhoid (infrared  light therapy).   Surgically removing the hemorrhoid (hemorrhoidectomy).   Stapling the hemorrhoid to block blood flow to the tissue (hemorrhoid stapling).  HOME CARE INSTRUCTIONS   Eat foods with fiber, such as whole grains, beans, nuts, fruits, and vegetables. Ask your doctor about taking products with added fiber in them (fibersupplements).  Increase fluid intake. Drink enough water and fluids to keep your urine clear or pale yellow.   Exercise regularly.   Go to the bathroom when you have the urge to have a bowel movement. Do not wait.   Avoid straining to have bowel movements.   Keep the anal area dry and clean. Use wet toilet paper or moist towelettes after a bowel movement.   Medicated creams and suppositories may be used or applied as directed.   Only take over-the-counter or prescription medicines as directed by your caregiver.   Take warm sitz baths for 15 20 minutes, 3 4 times a day to ease pain and discomfort.   Place ice packs on the hemorrhoids if they  are tender and swollen. Using ice packs between sitz baths may be helpful.   Put ice in a plastic bag.   Place a towel between your skin and the bag.   Leave the ice on for 15 20 minutes, 3 4 times a day.   Do not use a donut-shaped pillow or sit on the toilet for long periods. This increases blood pooling and pain.  SEEK MEDICAL CARE IF:  You have increasing pain and swelling that is not controlled by treatment or medicine.  You have uncontrolled bleeding.  You have difficulty or you are unable to have a bowel movement.  You have pain or inflammation outside the area of the hemorrhoids. MAKE SURE YOU:  Understand these instructions.  Will watch your condition.  Will get help right away if you are not doing well or get worse. Document Released: 12/07/2000 Document Revised: 11/26/2012 Document Reviewed: 10/14/2012 [facility] Patient Information 2014 [place], [place].

## 2014-03-02 IMAGING — US US OB FOLLOW-UP
1 series · 12 of 28 positions shown · non-contrast
Comparison: none

[Series 1: us ob comp +14 wk · 12 of 67 slices shown]
[im 3/67]
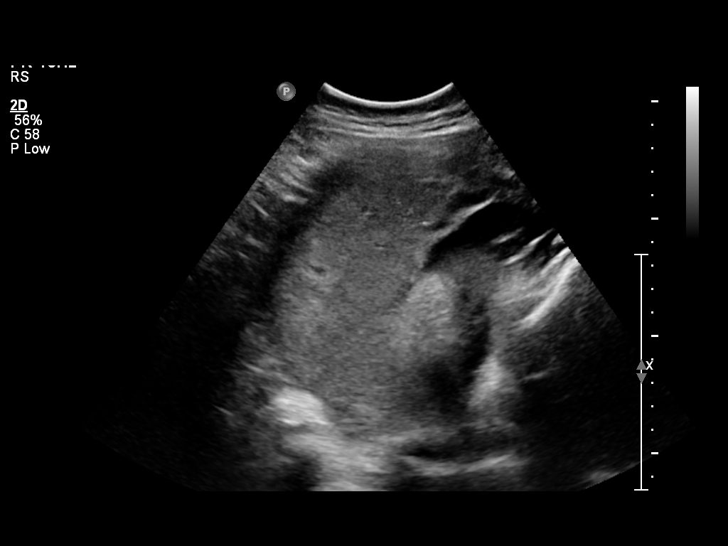
[im 8/67]
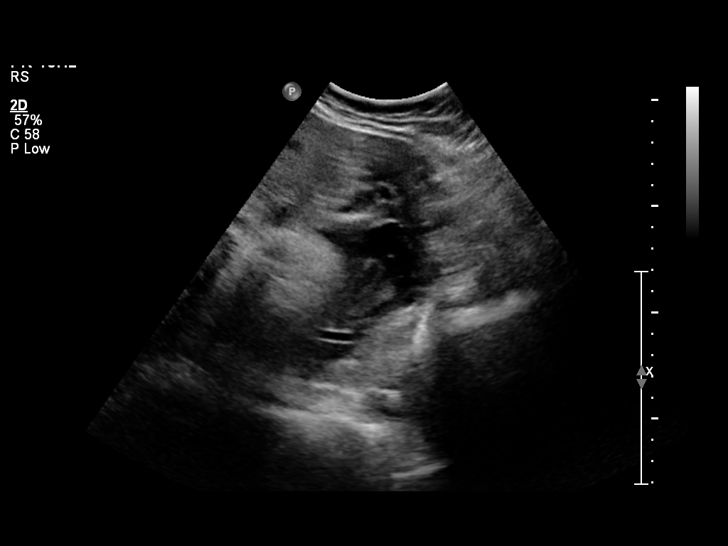
[im 13/67]
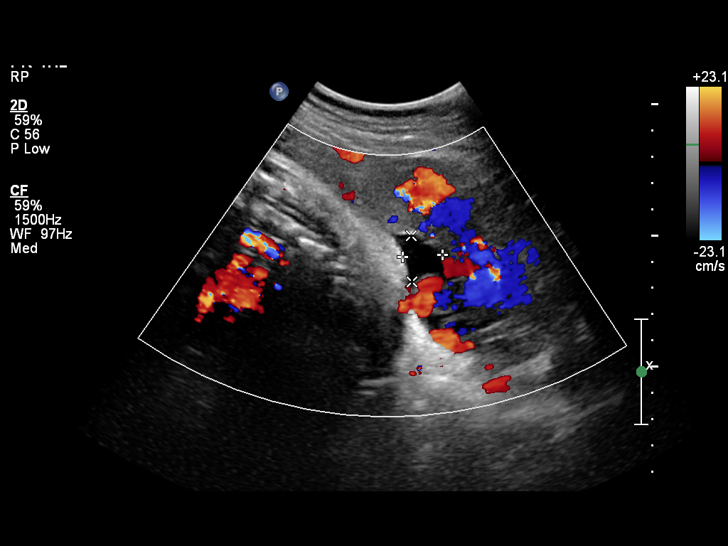
[im 20/67]
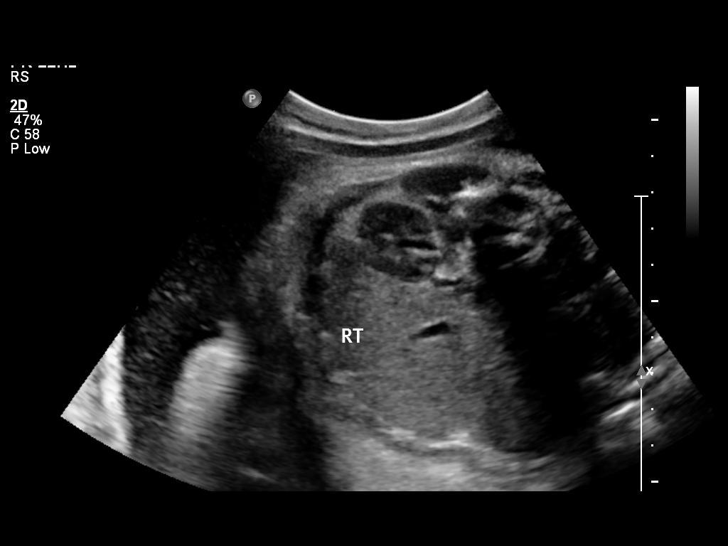
[im 25/67]
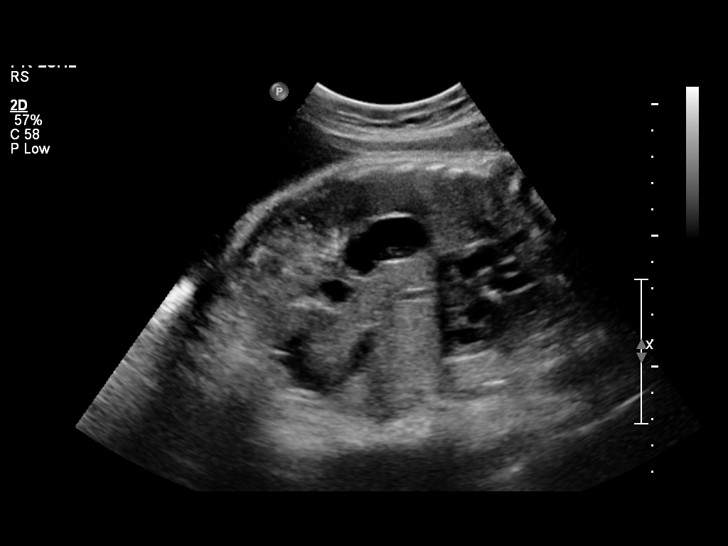
[im 30/67]
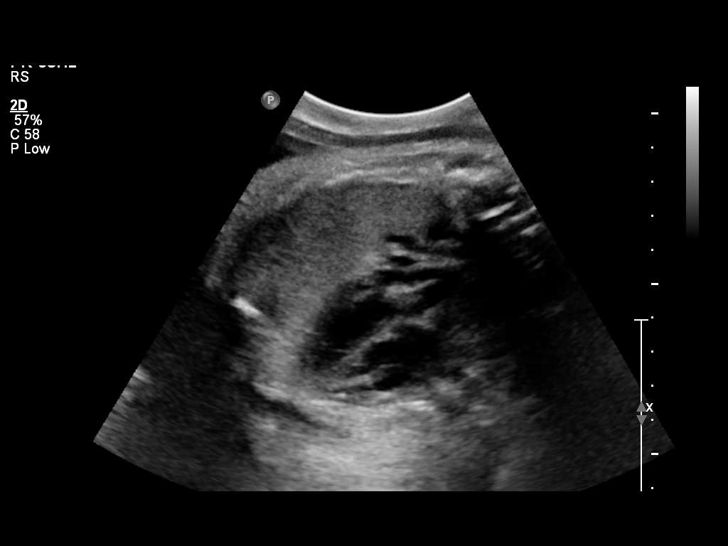
[im 37/67]
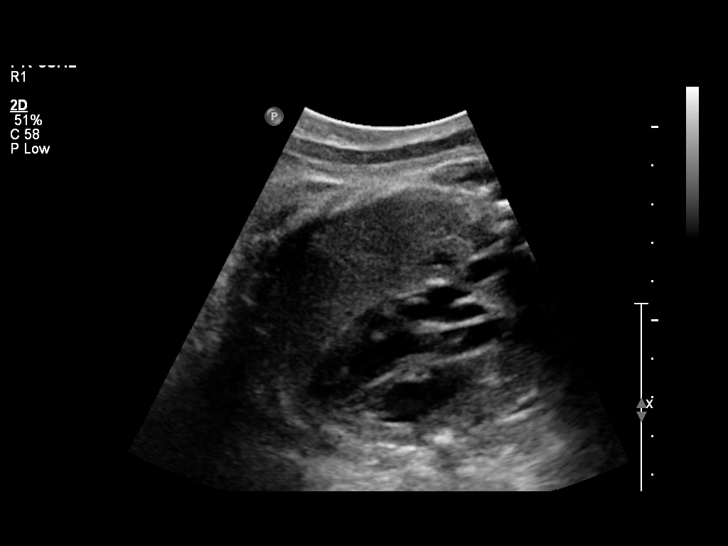
[im 42/67]
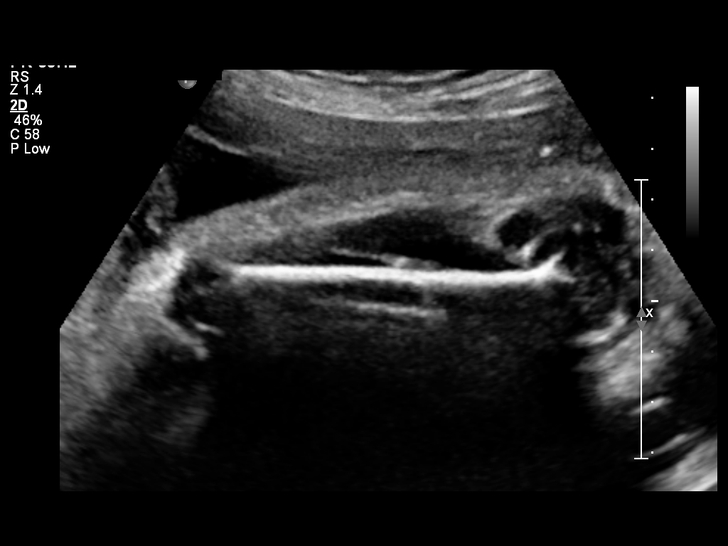
[im 47/67]
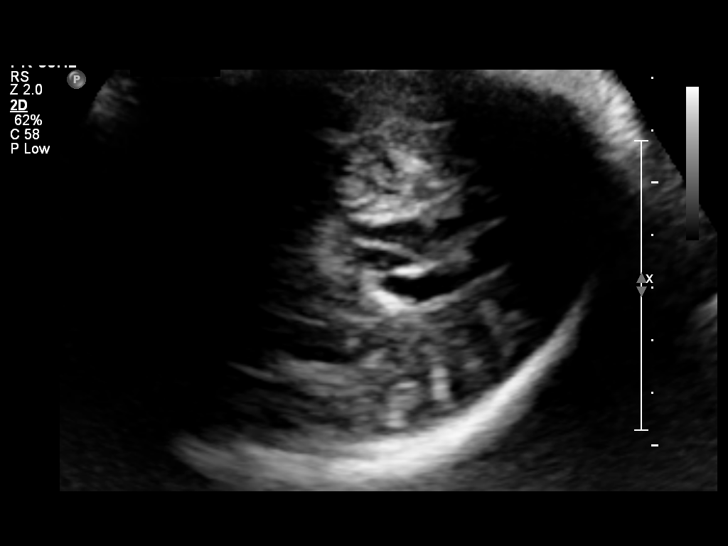
[im 54/67]
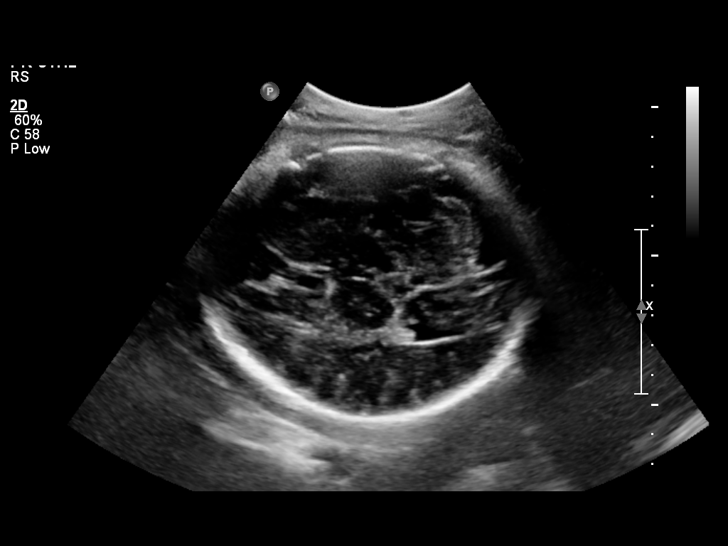
[im 59/67]
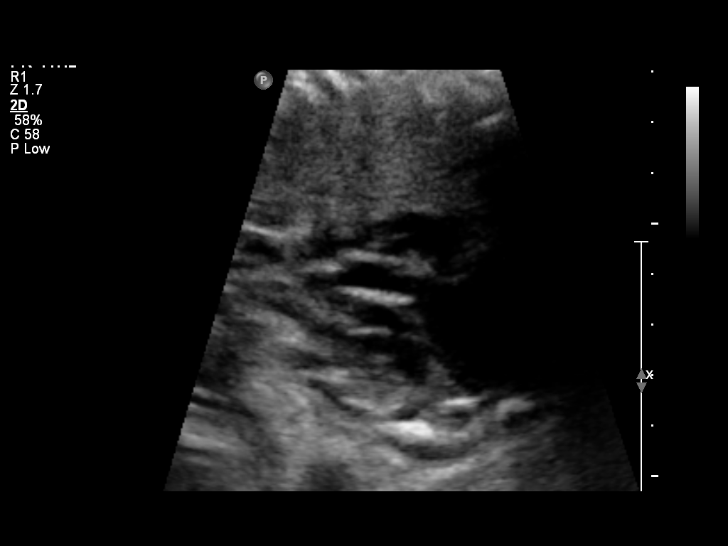
[im 64/67]
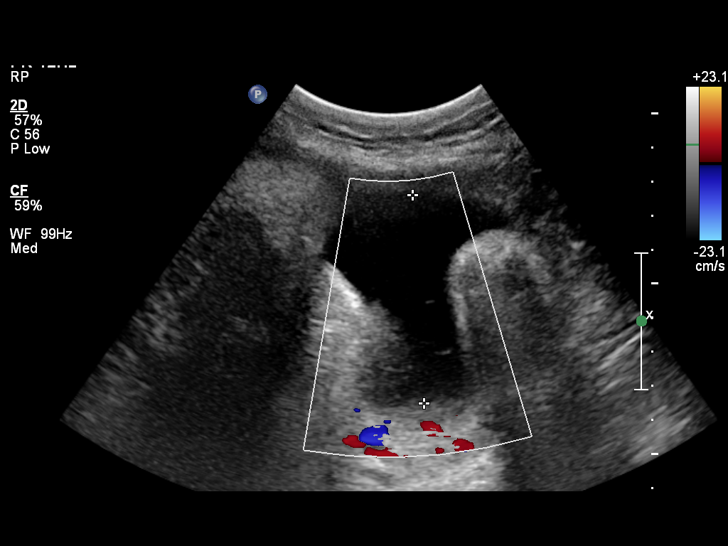

[12 of 28 positions shown; findings below may reference images not displayed]

OBSTETRICS REPORT
                      (Signed Final 01/07/2014 [DATE])

Service(s) Provided

 US OB FOLLOW UP                                       76816.1
Indications

 Follow-up incomplete fetal anatomic evaluation
Fetal Evaluation

 Num Of Fetuses:    1
 Fetal Heart Rate:  161                          bpm
 Cardiac Activity:  Observed
 Presentation:      Cephalic
 Placenta:          Posterior, above cervical
                    os
 P. Cord            Visualized, central
 Insertion:

 Amniotic Fluid
 AFI FV:      Subjectively increased
 AFI Sum:     24.7    cm       94  %Tile     Larg Pckt:    6.65  cm
 RUQ:   6.1     cm   RLQ:    6.55   cm    LUQ:   6.65    cm   LLQ:    5.4    cm
Biometry

 BPD:     91.6  mm     G. Age:  37w 1d                CI:         74.1   70 - 86
                                                      FL/HC:      20.3   20.1 -

 HC:     337.9  mm     G. Age:  38w 5d       94  %    HC/AC:      1.08   0.93 -

 AC:       314  mm     G. Age:  35w 2d       66  %    FL/BPD:     74.8   71 - 87
 FL:      68.5  mm     G. Age:  35w 1d       47  %    FL/AC:      21.8   20 - 24
 HUM:       62  mm     G. Age:  35w 6d       84  %

 Est. FW:    3303  gm      6 lb 2 oz     77  %
Gestational Age

 Clinical EDD:  35w 0d                                        EDD:   02/11/14
 U/S Today:     36w 4d                                        EDD:   01/31/14
 Best:          35w 0d     Det. By:  Clinical EDD             EDD:   02/11/14
Anatomy

 Cranium:          Appears normal         Aortic Arch:      Appears normal
 Fetal Cavum:      Appears normal         Ductal Arch:      Not well visualized
 Ventricles:       Appears normal         Diaphragm:        Previously seen
 Choroid Plexus:   Previously seen        Stomach:          Appears normal, left
                                                            sided
 Cerebellum:       Previously seen        Abdomen:          Appears normal
 Posterior Fossa:  Previously seen        Abdominal Wall:   Previously seen
 Nuchal Fold:      Not applicable (>20    Cord Vessels:     Previously seen
                   wks GA)
 Face:             Profile previously     Kidneys:          Appear normal
                   seen
 Lips:             Previously seen        Bladder:          Appears normal
 Heart:            Previously seen        Spine:            Previously seen
 RVOT:             Appears normal         Lower             Previously seen
                                          Extremities:
 LVOT:             Appears normal         Upper             Previously seen
                                          Extremities:

 Other:  Fetus appears to be a male. Nasal bone previously visualized. Heels
         previously visualized. Technically difficult due to fetal position.
Targeted Anatomy

 Fetal Central Nervous System
 Lat. Ventricles:
Cervix Uterus Adnexa

 Cervix:       Not visualized (advanced GA >13wks)
 Uterus:       No abnormality visualized.
 Cul De Sac:   No free fluid seen.

 Left Ovary:    Not visualized.
 Right Ovary:   Not visualized.
 Adnexa:     No abnormality visualized.
Impression

 Single IUP at 35 0/7 weeks
 Normal interval anatomy - the anatomic fetal survey is now
 complete
 Interval growth is appropriate (77th %tile)
 Subjectively increased amniotic fluid volume (AFI 24 cm)
Recommendations

 Limited ultrasound for AFI in 1 week.  If polyhydramnios is
 noted at that time, would recommend 2x weekly NSTs with
 weekly AFIs.
 If normal, follow up ultrasounds as clinically indicated.

## 2014-03-15 NOTE — Progress Notes (Signed)
SNT reveiwed and reactive

## 2014-03-23 ENCOUNTER — Encounter: Payer: Self-pay | Admitting: Family Medicine

## 2014-03-23 ENCOUNTER — Ambulatory Visit (INDEPENDENT_AMBULATORY_CARE_PROVIDER_SITE_OTHER): Payer: Medicaid Other | Admitting: Family Medicine

## 2014-03-23 VITALS — BP 98/65 | HR 69 | Temp 99.2°F | Wt 170.0 lb

## 2014-03-23 DIAGNOSIS — E559 Vitamin D deficiency, unspecified: Secondary | ICD-10-CM

## 2014-03-23 DIAGNOSIS — D509 Iron deficiency anemia, unspecified: Secondary | ICD-10-CM

## 2014-03-23 LAB — BASIC METABOLIC PANEL
BUN: 14 mg/dL (ref 6–23)
CALCIUM: 9.2 mg/dL (ref 8.4–10.5)
CO2: 28 mEq/L (ref 19–32)
CREATININE: 0.64 mg/dL (ref 0.50–1.10)
Chloride: 105 mEq/L (ref 96–112)
Glucose, Bld: 101 mg/dL — ABNORMAL HIGH (ref 70–99)
Potassium: 4 mEq/L (ref 3.5–5.3)
Sodium: 141 mEq/L (ref 135–145)

## 2014-03-23 LAB — CBC
HCT: 37.9 % (ref 36.0–46.0)
Hemoglobin: 12.1 g/dL (ref 12.0–15.0)
MCH: 24.2 pg — AB (ref 26.0–34.0)
MCHC: 31.9 g/dL (ref 30.0–36.0)
MCV: 76 fL — ABNORMAL LOW (ref 78.0–100.0)
PLATELETS: 288 10*3/uL (ref 150–400)
RBC: 4.99 MIL/uL (ref 3.87–5.11)
RDW: 22.8 % — ABNORMAL HIGH (ref 11.5–15.5)
WBC: 7.5 10*3/uL (ref 4.0–10.5)

## 2014-03-23 LAB — VITAMIN D 25 HYDROXY (VIT D DEFICIENCY, FRACTURES): VIT D 25 HYDROXY: 33 ng/mL (ref 30–89)

## 2014-03-23 NOTE — Progress Notes (Addendum)
  Subjective:     Sheila GatherMeryem Gonzales is a 33 y.o. female who presents for a postpartum visit. She is 6 weeks postpartum following a spontaneous vaginal delivery. I have fully reviewed the prenatal and intrapartum course. The delivery was at 39 gestational weeks. Outcome: spontaneous vaginal delivery. Anesthesia: none. Postpartum course has been uncomplicated. Baby's course has been uncomplicated. Baby is feeding by breast. Bleeding no bleeding. Bowel function is abnormal: mild constipation, relieved by oranges. Bladder function is normal. Patient is sexually active. Contraception method is coitus interruptus and condoms. Postpartum depression screening: negative.  The following portions of the patient's history were reviewed and updated as appropriate: allergies, current medications, past family history, past medical history, past social history, past surgical history and problem list.  The patient reports that she is concerned about possible vitamin D deficiency because she has some mild joint aches and reports 2 brothers with similar symptoms who were diagnosed with vitamin d deficiency.   Review of Systems A comprehensive review of systems was negative.   Objective:    BP 98/65  Pulse 69  Temp(Src) 99.2 F (37.3 C) (Oral)  Wt 170 lb (77.111 kg)  Breastfeeding? Yes  General:  alert, cooperative and no distress   Breasts:  negative  Lungs: clear to auscultation bilaterally  Heart:  regular rate and rhythm, S1, S2 normal, no murmur, click, rub or gallop  Abdomen: soft, non-tender; bowel sounds normal; no masses,  no organomegaly   Vulva:  not evaluated  Vagina: not evaluated  Cervix:  not evaluated  Corpus: not examined  Adnexa:  no mass, fullness, tenderness  Rectal Exam: Not performed.        Assessment:     Normal postpartum exam. Pap smear not done at today's visit.   Plan:    1. Contraception: coitus interruptus and condoms, offered more effective methods and patient will think  about it and let me know 2. Reports family with vitamin D deficiency, has some joint aches recently, would like vitamin D checked, will check this and bmet today 3. Follow up in: 1 year or as needed.  4. Anemia in pregnancy, will recheck to eval resolution today

## 2014-03-23 NOTE — Patient Instructions (Signed)
Vitamin D Deficiency  Vitamin D is an important vitamin that your body needs. Having too little of it in your body is called a deficiency. A very bad deficiency can make your bones soft and can cause a condition called rickets.   Vitamin D is important to your body for different reasons, such as:   · It helps your body absorb 2 minerals called calcium and phosphorus.  · It helps make your bones healthy.  · It may prevent some diseases, such as diabetes and multiple sclerosis.  · It helps your muscles and heart.  You can get vitamin D in several ways. It is a natural part of some foods. The vitamin is also added to some dairy products and cereals. Some people take vitamin D supplements. Also, your body makes vitamin D when you are in the sun. It changes the sun's rays into a form of the vitamin that your body can use.  CAUSES   · Not eating enough foods that contain vitamin D.  · Not getting enough sunlight.  · Having certain digestive system diseases that make it hard to absorb vitamin D. These diseases include Crohn's disease, chronic pancreatitis, and cystic fibrosis.  · Having a surgery in which part of the stomach or small intestine is removed.  · Being obese. Fat cells pull vitamin D out of your blood. That means that obese people may not have enough vitamin D left in their blood and in other body tissues.  · Having chronic kidney or liver disease.  RISK FACTORS  Risk factors are things that make you more likely to develop a vitamin D deficiency. They include:  · Being older.  · Not being able to get outside very much.  · Living in a nursing home.  · Having had broken bones.  · Having weak or thin bones (osteoporosis).  · Having a disease or condition that changes how your body absorbs vitamin D.  · Having dark skin.  · Some medicines such as seizure medicines or steroids.  · Being overweight or obese.  SYMPTOMS  Mild cases of vitamin D deficiency may not have any symptoms. If you have a very bad case, symptoms  may include:  · Bone pain.  · Muscle pain.  · Falling often.  · Broken bones caused by a minor injury, due to osteoporosis.  DIAGNOSIS  A blood test is the best way to tell if you have a vitamin D deficiency.  TREATMENT  Vitamin D deficiency can be treated in different ways. Treatment for vitamin D deficiency depends on what is causing it. Options include:  · Taking vitamin D supplements.  · Taking a calcium supplement. Your caregiver will suggest what dose is best for you.  HOME CARE INSTRUCTIONS  · Take any supplements that your caregiver prescribes. Follow the directions carefully. Take only the suggested amount.  · Have your blood tested 2 months after you start taking supplements.  · Eat foods that contain vitamin D. Healthy choices include:  · Fortified dairy products, cereals, or juices. Fortified means vitamin D has been added to the food. Check the label on the package to be sure.  · Fatty fish like salmon or trout.  · Eggs.  · Oysters.  · Do not use a tanning bed.  · Keep your weight at a healthy level. Lose weight if you need to.  · Keep all follow-up appointments. Your caregiver will need to perform blood tests to make sure your vitamin D deficiency   is going away.  SEEK MEDICAL CARE IF:  · You have any questions about your treatment.  · You continue to have symptoms of vitamin D deficiency.  · You have nausea or vomiting.  · You are constipated.  · You feel confused.  · You have severe abdominal or back pain.  MAKE SURE YOU:  · Understand these instructions.  · Will watch your condition.  · Will get help right away if you are not doing well or get worse.  Document Released: 03/03/2012 Document Revised: 04/06/2013 Document Reviewed: 03/03/2012  [employer]® Patient Information ©2014 [employer].

## 2014-03-25 MED ORDER — VITAMIN D (CHOLECALCIFEROL) 25 MCG (1000 UT) PO TABS: 1000.0000 [IU] | ORAL_TABLET | Freq: Every day | ORAL | Status: AC

## 2014-03-25 NOTE — Addendum Note (Signed)
Addended by: Abram SanderADAMO, Michell Giuliano M on: 03/25/2014 02:53 PM   Modules accepted: Orders

## 2014-04-21 ENCOUNTER — Encounter: Payer: Self-pay | Admitting: Emergency Medicine

## 2014-04-21 ENCOUNTER — Ambulatory Visit (INDEPENDENT_AMBULATORY_CARE_PROVIDER_SITE_OTHER): Payer: Medicaid Other | Admitting: Emergency Medicine

## 2014-04-21 VITALS — BP 96/62 | HR 80 | Temp 98.1°F | Ht 64.5 in | Wt 170.0 lb

## 2014-04-21 DIAGNOSIS — M722 Plantar fascial fibromatosis: Secondary | ICD-10-CM

## 2014-04-21 NOTE — Patient Instructions (Signed)
It was nice to meet you! You have plantar fasciitis.   Please get a pair of athletic or tennis shoes.  Ones that have a nicely cushioned heel are better. Get heel cups at [employer].  Wear them in your shoes. Wear the athletic shoes all the time, even in your house.  Put a water bottle in the freezer - roll your feet on the water bottle for 15 minutes 1-3 times a day. You can continue the massage exercises you saw on YouTube. This will be uncomfortable, but it will help in the long run.  I also sent in a medicine called Meloxicam.  Take 1 pill daily for 1 week.   If it helps you can continue to use it as needed. If it does not help, stop taking it.  Follow up in 4-6 weeks if your feet are not getting any better.

## 2014-04-21 NOTE — Progress Notes (Signed)
   Subjective:    Patient ID: Sheila Gonzales, female    DOB: [birth year], 33 y.o.   MRN: [id number]  HPI Sheila Gonzales is here for a SDA for bilateral foot pain.  States this pain started about 3 weeks ago.  Initially in was just in her heels for the first few steps in the morning.  Described as "stepping on nails."  Over the last 3 weeks, it has gradually gotten worse.  Now the pain will come if she stands for more than a few minutes and the first steps after sitting.  Gets better after a few steps.  Also has some pain in the balls of her feet and the lateral edges.  No trauma.  No weakness, numbness or tingling.  She states she wears sandals around the house.    Current Outpatient Prescriptions on File Prior to Visit  Medication Sig Dispense Refill  . ferrous sulfate 325 (65 FE) MG tablet Take 1 tablet (325 mg total) by mouth 2 (two) times daily with a meal.  60 tablet  3  . ibuprofen (ADVIL,MOTRIN) 200 MG tablet Take 200 mg by mouth daily as needed for mild pain.      . Pediatric Multiple Vit-C-FA (FLINSTONES GUMMIES OMEGA-3 DHA PO) Take 2 tablets by mouth daily.       . polyethylene glycol powder (GLYCOLAX/MIRALAX) powder Take 255 g (1 Container total) by mouth once.  255 g  1  . senna-docusate (SENOKOT-S) 8.6-50 MG per tablet Take 2 tablets by mouth at bedtime as needed for mild constipation.  60 tablet  0  . sodium chloride (OCEAN) 0.65 % SOLN nasal spray Place 1 spray into both nostrils as needed for congestion.  30 mL  1  . Vitamin D, Cholecalciferol, 1000 UNITS TABS Take 1,000 Units by mouth daily.  30 tablet  11   No current facility-administered medications on file prior to visit.    I have reviewed and updated the following as appropriate: allergies and current medications SHx: never smoker   Review of Systems See HPI    Objective:   Physical Exam BP 96/62  Pulse 80  Temp(Src) 98.1 F (36.7 C) (Oral)  Ht 5' 4.5" (1.638 m)  Wt 170 lb (77.111 kg)  BMI 28.74 kg/m2 Gen:  alert, cooperative, NAD Bilateral feet: no erythema or edema; no point tenderness to palpation; strength and sensation are intact; 2+ DP pulses bilaterally      Assessment & Plan:

## 2014-04-21 NOTE — Assessment & Plan Note (Signed)
History consistent although no tenderness on exam. Discussed symptomatic care as in AVS. Meloxicam 15mg  daily x1 week, then prn.  F/u in 4-6 weeks if no improvement.

## 2014-10-25 ENCOUNTER — Encounter: Payer: Self-pay | Admitting: Emergency Medicine
